# Patient Record
Sex: Female | Born: 1970 | Race: White | Hispanic: No | Smoking: Former smoker
Health system: Southern US, Community
[De-identification: ages and names within clinical notes are randomized; demographics above are authoritative.]

## PROBLEM LIST (undated history)

## (undated) DIAGNOSIS — L98499 Non-pressure chronic ulcer of skin of other sites with unspecified severity: Secondary | ICD-10-CM

## (undated) DIAGNOSIS — G43909 Migraine, unspecified, not intractable, without status migrainosus: Secondary | ICD-10-CM

## (undated) DIAGNOSIS — B009 Herpesviral infection, unspecified: Secondary | ICD-10-CM

## (undated) DIAGNOSIS — F32A Depression, unspecified: Secondary | ICD-10-CM

## (undated) DIAGNOSIS — F329 Major depressive disorder, single episode, unspecified: Secondary | ICD-10-CM

## (undated) DIAGNOSIS — L98439 Non-pressure chronic ulcer of abdomen with unspecified severity: Secondary | ICD-10-CM

## (undated) DIAGNOSIS — K219 Gastro-esophageal reflux disease without esophagitis: Secondary | ICD-10-CM

## (undated) HISTORY — PX: OTHER SURGICAL HISTORY: SHX169

## (undated) HISTORY — PX: CHOLECYSTECTOMY: SHX55

---

## 2004-02-08 HISTORY — PX: GASTRIC BYPASS: SHX52

## 2006-06-17 ENCOUNTER — Other Ambulatory Visit: Payer: Self-pay

## 2006-06-17 ENCOUNTER — Emergency Department: Payer: Self-pay | Admitting: Unknown Physician Specialty

## 2007-08-22 ENCOUNTER — Ambulatory Visit: Payer: Self-pay | Admitting: Gastroenterology

## 2007-10-08 ENCOUNTER — Ambulatory Visit: Payer: Self-pay

## 2008-06-19 ENCOUNTER — Ambulatory Visit: Payer: Self-pay | Admitting: Internal Medicine

## 2009-02-25 ENCOUNTER — Ambulatory Visit: Payer: Self-pay

## 2009-12-27 ENCOUNTER — Emergency Department: Payer: Self-pay | Admitting: Emergency Medicine

## 2010-06-10 ENCOUNTER — Ambulatory Visit: Payer: Self-pay | Admitting: Internal Medicine

## 2010-12-22 ENCOUNTER — Emergency Department: Payer: Self-pay

## 2011-08-17 ENCOUNTER — Ambulatory Visit: Payer: Self-pay | Admitting: Family Medicine

## 2012-07-30 ENCOUNTER — Ambulatory Visit: Payer: Self-pay

## 2012-07-30 ENCOUNTER — Inpatient Hospital Stay: Payer: Self-pay | Admitting: Specialist

## 2012-07-30 LAB — COMPREHENSIVE METABOLIC PANEL
Albumin: 3.4 g/dL (ref 3.4–5.0)
Anion Gap: 6 — ABNORMAL LOW (ref 7–16)
BUN: 13 mg/dL (ref 7–18)
Calcium, Total: 8.2 mg/dL — ABNORMAL LOW (ref 8.5–10.1)
Chloride: 107 mmol/L (ref 98–107)
Co2: 29 mmol/L (ref 21–32)
EGFR (African American): >60
EGFR (Non-African Amer.): >60
Glucose: 89 mg/dL (ref 65–99)
Osmolality: 283 (ref 275–301)
Potassium: 3.5 mmol/L (ref 3.5–5.1)
SGOT(AST): 19 U/L (ref 15–37)
SGPT (ALT): 19 U/L (ref 12–78)
Sodium: 142 mmol/L (ref 136–145)

## 2012-07-30 LAB — CBC
HCT: 33.5 % — ABNORMAL LOW (ref 35.0–47.0)
MCHC: 32.2 g/dL (ref 32.0–36.0)
MCV: 80 fL (ref 80–100)
RDW: 14.3 % (ref 11.5–14.5)
WBC: 6.2 10*3/uL (ref 3.6–11.0)

## 2012-07-30 LAB — URINALYSIS, COMPLETE
Bacteria: NONE SEEN
Blood: NEGATIVE
Nitrite: NEGATIVE
Ph: 5 (ref 4.5–8.0)
RBC,UR: 1 /HPF (ref 0–5)
Squamous Epithelial: 1
WBC UR: 2 /HPF (ref 0–5)

## 2012-07-31 LAB — HEMOGLOBIN
HGB: 10 g/dL — ABNORMAL LOW (ref 12.0–16.0)
HGB: 10.5 g/dL — ABNORMAL LOW (ref 12.0–16.0)

## 2012-07-31 LAB — MAGNESIUM: Magnesium: 1.5 mg/dL — ABNORMAL LOW

## 2012-08-01 LAB — CBC WITH DIFFERENTIAL/PLATELET
Basophil #: 0 10*3/uL (ref 0.0–0.1)
Basophil %: 0.6 %
Eosinophil #: 0.2 10*3/uL (ref 0.0–0.7)
HCT: 30.1 % — ABNORMAL LOW (ref 35.0–47.0)
HGB: 9.8 g/dL — ABNORMAL LOW (ref 12.0–16.0)
MCH: 26.1 pg (ref 26.0–34.0)
MCHC: 32.6 g/dL (ref 32.0–36.0)
MCV: 80 fL (ref 80–100)
Monocyte %: 9.1 %
Neutrophil #: 1.9 10*3/uL (ref 1.4–6.5)
Neutrophil %: 54.5 %
Platelet: 232 10*3/uL (ref 150–440)
RBC: 3.76 10*6/uL — ABNORMAL LOW (ref 3.80–5.20)
RDW: 14.4 % (ref 11.5–14.5)
WBC: 3.4 10*3/uL — ABNORMAL LOW (ref 3.6–11.0)

## 2012-08-01 LAB — MAGNESIUM: Magnesium: 1.7 mg/dL — ABNORMAL LOW

## 2012-08-22 ENCOUNTER — Emergency Department: Payer: Self-pay | Admitting: Emergency Medicine

## 2012-08-22 LAB — URINALYSIS, COMPLETE
Bilirubin,UR: NEGATIVE
Ketone: NEGATIVE
Nitrite: NEGATIVE
Protein: NEGATIVE
Squamous Epithelial: 4
WBC UR: 6 /HPF (ref 0–5)

## 2012-08-22 LAB — COMPREHENSIVE METABOLIC PANEL
Albumin: 3.7 g/dL (ref 3.4–5.0)
Alkaline Phosphatase: 103 U/L (ref 50–136)
Anion Gap: 5 — ABNORMAL LOW (ref 7–16)
BUN: 14 mg/dL (ref 7–18)
Bilirubin,Total: 0.3 mg/dL (ref 0.2–1.0)
Calcium, Total: 8.5 mg/dL (ref 8.5–10.1)
Chloride: 109 mmol/L — ABNORMAL HIGH (ref 98–107)
Co2: 27 mmol/L (ref 21–32)
Creatinine: 0.99 mg/dL (ref 0.60–1.30)
EGFR (African American): >60
EGFR (Non-African Amer.): >60
Glucose: 91 mg/dL (ref 65–99)
Potassium: 3.7 mmol/L (ref 3.5–5.1)
SGOT(AST): 18 U/L (ref 15–37)

## 2012-08-22 LAB — CBC
HCT: 34.5 % — ABNORMAL LOW (ref 35.0–47.0)
HGB: 10.9 g/dL — ABNORMAL LOW (ref 12.0–16.0)
MCH: 25.2 pg — ABNORMAL LOW (ref 26.0–34.0)
MCV: 80 fL (ref 80–100)
RBC: 4.31 10*6/uL (ref 3.80–5.20)
RDW: 15.2 % — ABNORMAL HIGH (ref 11.5–14.5)
WBC: 5.1 10*3/uL (ref 3.6–11.0)

## 2012-08-22 LAB — LIPASE, BLOOD: Lipase: 184 U/L (ref 73–393)

## 2012-08-22 LAB — PREGNANCY, URINE: Pregnancy Test, Urine: NEGATIVE m[IU]/mL

## 2012-08-23 ENCOUNTER — Emergency Department: Payer: Self-pay

## 2012-08-23 LAB — CBC
HCT: 33.7 % — ABNORMAL LOW (ref 35.0–47.0)
HGB: 10.9 g/dL — ABNORMAL LOW (ref 12.0–16.0)
MCH: 25.5 pg — ABNORMAL LOW (ref 26.0–34.0)
MCHC: 32.5 g/dL (ref 32.0–36.0)
MCV: 79 fL — ABNORMAL LOW (ref 80–100)
Platelet: 325 10*3/uL (ref 150–440)
RDW: 15.2 % — ABNORMAL HIGH (ref 11.5–14.5)
WBC: 5 10*3/uL (ref 3.6–11.0)

## 2012-08-23 LAB — URINALYSIS, COMPLETE
Ketone: NEGATIVE
Ph: 5 (ref 4.5–8.0)
Protein: NEGATIVE
Specific Gravity: 1.026 (ref 1.003–1.030)
Squamous Epithelial: 9
WBC UR: 4 /HPF (ref 0–5)

## 2012-08-23 LAB — LIPASE, BLOOD: Lipase: 218 U/L (ref 73–393)

## 2012-08-23 LAB — COMPREHENSIVE METABOLIC PANEL
Alkaline Phosphatase: 105 U/L (ref 50–136)
Calcium, Total: 8.6 mg/dL (ref 8.5–10.1)
Co2: 26 mmol/L (ref 21–32)
Creatinine: 0.77 mg/dL (ref 0.60–1.30)
EGFR (African American): >60
Potassium: 3.6 mmol/L (ref 3.5–5.1)
SGOT(AST): 17 U/L (ref 15–37)
SGPT (ALT): 20 U/L (ref 12–78)

## 2012-10-04 ENCOUNTER — Encounter: Payer: Self-pay | Admitting: *Deleted

## 2012-10-23 ENCOUNTER — Ambulatory Visit: Payer: Self-pay | Admitting: General Surgery

## 2013-01-16 ENCOUNTER — Ambulatory Visit: Payer: Self-pay | Admitting: Internal Medicine

## 2014-05-30 NOTE — H&P (Signed)
PATIENT NAME:  Melanie Olson, Melanie Olson MR#:  [id number] DATE OF BIRTH:  [birth year]  DATE OF ADMISSION:  07/30/2012  PRIMARY CARE PHYSICIAN:  Dr.BURNS REFERRING PHYSICIAN:  Dr. Manson Passey  CHIEF COMPLAINT:  Abdominal pain for 1 week, nausea, vomiting blood today.   HISTORY OF PRESENT ILLNESS:  A 44 year old Caucasian female with a history of anemia, obesity status post gastric bypass who presented to the ED with abdominal pain and vomiting blood today. The patient is alert, awake and oriented in no acute distress. According to the patient, she has had abdominal pain for the past 1 week which is in the epigastric area with cramps, no radiation and relieved by food and exacerbated by movement. She took aspirin for the abdominal pain for the past week. She developed nausea today and then started vomiting a large amount of blood at about 2 to 3 cups. She felt dizzy and weak so she came to the ED for further evaluation.   PAST MEDICAL HISTORY:  Anemia, obesity.   PAST SURGICAL HISTORY:  Gastric bypass, cholecystectomy   FAMILY HISTORY:  Father has hypertension, otherwise negative.   SOCIAL HISTORY:  No smoking or drinking or illicit drugs.   ALLERGIES:  PENICILLIN.   HOME MEDICATIONS:  Acyclovir 200 mg p.o. 4 capsules b.i.d.   REVIEW OF SYSTEMS:  CONSTITUTIONAL: The patient denies any fever or chills. No headache, but has dizziness and weakness. No weight loss.  EYES: No double or blurred vision.  ENT: No postnasal drip, slurred speech or dysphagia. No epistaxis.  CARDIOVASCULAR: No chest pain, palpitation, orthopnea or nocturnal dyspnea. No leg edema.  PULMONARY: No cough, sputum, shortness of breath or hemoptysis.  GASTROINTESTINAL: Positive for abdominal pain, nausea, vomiting and hemoptysis. No black stool or bloody stool, but has constipation.  SKIN: No rash or jaundice.  MUSCULOSKELETAL: No muscle pain. No leg edema.  GENITOURINARY: No dysuria, hematuria or incontinence.  HEMATOLOGY: No easy  bruising or bleeding.  ENDOCRINE: No polyuria, polydipsia, heat or cold intolerance.  NEUROLOGY: No syncope, loss of consciousness or seizure.  PSYCHIATRY: No anxiety or depression, but has had a lot of stress recently.   PHYSICAL EXAMINATION: VITAL SIGNS: Temperature 98.6, blood pressure 112/68, pulse 70, respirations 14, oxygen saturation 100% on room air.  GENERAL: The patient is alert, awake and oriented in no acute distress.  HEENT: Pupils round, equal and reactive to light and accommodation. Moist oral mucosa. Clear oropharynx. Pink conjunctivae.  NECK: Supple. No JVD or carotid bruits. No lymphadenopathy. No thyromegaly.  CARDIOVASCULAR: S1, S2, regular rate, rhythm. No murmurs or gallops.  PULMONARY: Bilateral air entry. No wheezing or rales. No use of accessory muscles to breathe.  ABDOMEN: Soft, obese, tenderness in epigastric area. No rigidity, no rebound, no organomegaly. Bowel sounds present.  EXTREMITIES: No edema, clubbing or cyanosis. No calf tenderness. Strong bilateral pedal pulses.  SKIN: No rash or jaundice. No bruises.  NEUROLOGIC: Alert and oriented x 3. No focal deficit. Power 5 out of 5. Sensation is intact.   DIAGNOSTIC DATA:  Abdominal CT showed no acute abdominopelvic pathology. WBC is 6.2, hemoglobin 10.8, platelets 301. Glucose is 89, BUN 13, creatinine 0.75, electrolytes normal. Lipase is 128. Urinalysis is negative. EKG showed normal sinus rhythm at 72 BPM with incomplete right bundle branch block.   IMPRESSIONS:   1.  Upper gastrointestinal bleeding, need to rule out peptic ulcer disease.  2.  Anemia.  3.  Obesity.   PLAN OF TREATMENT:   1.  The patient will be admitted  to the medical floor. We will keep n.p.o., start Protonix drip, closely monitor hemoglobin q. 6 hours x 3. We will get a GI consult for possible EGD.  2.  We will start on normal saline IV and fluid support.  3.  I discussed the patient's condition and the plan of treatment with the patient  and the patient's mother.  4.  The patient is full code.   TIME SPENT:  About 53 minutes.    ____________________________ Shaune PollackQing Ransome Helwig, MD qc:si D: 07/30/2012 20:54:00 ET T: 07/30/2012 21:50:03 ET JOB#: [id number]  cc: Shaune PollackQing Van Seymore, MD, <Dictator> Shaune PollackQING Joyclyn Plazola MD ELECTRONICALLY SIGNED 07/31/2012 15:21

## 2014-05-30 NOTE — Consult Note (Signed)
VITAL SIGNS/ANCILLARY NOTES: **Vital Signs.:   25-Jun-14 13:28  Vital Signs Type Routine  Temperature Temperature (F) 98.1  Celsius 36.7  Temperature Source oral  Pulse Pulse 61  Respirations Respirations 20  Systolic BP Systolic BP 107  Diastolic BP (mmHg) Diastolic BP (mmHg) 71  Mean BP 83  Pulse Ox % Pulse Ox % 100  Pulse Ox Activity Level  At rest  Oxygen Delivery Room Air/ 21 %   Brief Assessment:  Cardiac Regular   Respiratory clear BS   Gastrointestinal details normal Soft  Nondistended  No masses palpable  Bowel sounds normal  No rebound tenderness  mild epigastric tenderness/pain to palpation   Lab Results: Routine Hem:  25-Jun-14 04:15   WBC (CBC)  3.4  RBC (CBC)  3.76  Hemoglobin (CBC)  9.8  Hematocrit (CBC)  30.1  Platelet Count (CBC) 232  MCV 80  MCH 26.1  MCHC 32.6  RDW 14.4  Neutrophil % 54.5  Lymphocyte % 30.6  Monocyte % 9.1  Eosinophil % 5.2  Basophil % 0.6  Neutrophil # 1.9  Lymphocyte # 1.1  Monocyte # 0.3  Eosinophil # 0.2  Basophil # 0.0 (Result(s) reported on 01 Aug 2012 at 05:11AM.)   Radiology Results: CT:    23-Jun-14 19:37, CT Abdomen and Pelvis With Contrast  CT Abdomen and Pelvis With Contrast   REASON FOR EXAM:    (1) generalized abdominal pain and hematemesis; (2)   generalized abdominal pain a  COMMENTS:       PROCEDURE: CT  - CT ABDOMEN / PELVIS  W  - Jul 30 2012  7:37PM     RESULT: History: Abdominal pain    Comparison:  None    Technique: Multiple axial images of the abdomen and pelvis were performed   from the lung bases to the pubic symphysis, without p.o. contrast and   with 100 ml of Isovue 300 intravenous contrast.    Findings:  The lung bases are clear. There is no pneumothorax. The heart size is   normal.     The liver demonstrates no focal abnormality. There is no intrahepatic or   extrahepatic biliary ductal dilatation. The gallbladder is surgically   absent. The spleen demonstrates no focal  abnormality. The kidneys,   adrenal glands, and pancreas are normal. The bladder is unremarkable.     Evaluation of the bowel is limited secondary to lack of enteric contrast.   There is evidence of prior gastric bypass surgery. There is no bowel   dilatation to suggest obstruction. There is a T type IUD noted. There is   no pneumoperitoneum, pneumatosis, or portal venous gas. There is no   abdominal or pelvic free fluid. There is no lymphadenopathy.     The abdominal aorta is normal in caliber .  The osseous structures areunremarkable.    IMPRESSION:     1. No acute abdominal or pelvic pathology.    Dictation Site: 1        Verified By: Joellyn Haff, M.D., MD   Assessment/Plan:  Assessment/Plan:  Assessment 1)  epigastric pain and hematemesis-stable/no recurrent bleeding, less pain.   2) h/o gastric bypass (roux-en-y)   Plan 1) egd today.  I have discussed the risks benefits and complications of egd to include not limited to bleeding infection perforation and sedation and she wishes to proceed.  Further recs to follow.   Electronic Signatures: Barnetta Chapel (MD)  (Signed 25-Jun-14 13:51)  Authored: VITAL SIGNS/ANCILLARY NOTES, Brief Assessment, Lab Results,  Radiology Results, Assessment/Plan   Last Updated: 25-Jun-14 13:51 by Barnetta ChapelSkulskie, Martin (MD)

## 2014-05-30 NOTE — Consult Note (Signed)
PATIENT NAME:  Melanie Olson, Melanie Olson MR#:  [id number] DATE OF BIRTH:  1970/04/04  DATE OF CONSULTATION:  07/31/2012  REFERRING PHYSICIAN:  Dr. Imogene Burnhen CONSULTING PHYSICIAN:  Melanie Barrehristiane H. Shamieka Gullo, NP  REASON FOR CONSULTATION: GI has been consulted at the request of Dr. Imogene Burnhen to evaluate GI bleeding, anemia.  I appreciate consult for this 44 year old Caucasian woman with history of Roux-en-Y gastric bypass done in 2006, I believe, for evaluation of epigastric pain and hematemesis. The patient reports burning epigastric pain for about a year, has been taking omeprazole 20 mg p.o. daily for about that long, has been using NSAIDs on and off on a fairly consistent basis to help with the abdominal pain and various myalgias.  She stopped using ibuprofen a few months ago when her stomach started burning more, with some improvement.  She had been taking several aspirins over the last week prior to her episode of hematemesis yesterday that necessitated her ED visit and subsequent admission.  She describes her emesis yesterday as coffee-ground with reddish-like swirls. She also reports a history of chronic constipation, last bowel movement was 06/21 described as small and pebbly.  Denies melena, hematochezia, problems swallowing or further GI complaints. She also states that food generally makes her epigastric pain somewhat better and movement makes it worse. Her hemoglobin since admission has run 10.8, 10.0, 10.5. Her platelets have been normal. She had a CT yesterday that did not demonstrate any acute abnormalities.   PAST MEDICAL HISTORY: Anemia, obesity, gastric bypass, cholecystectomy.   FAMILY HISTORY: Significant for hypertension, otherwise negative for colorectal cancer, colon polyps, liver disease, peptic ulcer disease.   SOCIAL HISTORY: Nature conservation officerMarshal arts instructor. No tobacco, EtOH, illicits. Lives at home with her family.   ALLERGIES: PENICILLIN.   HOME MEDICATIONS: Acyclovir 200 mg 4 caps b.i.[name], omeprazole 20 mg  p.o. q. day.   REVIEW OF SYSTEMS: Last menstrual period was 07/19/2012.  Ten systems were reviewed, negative other than what is written above.   LABORATORY DATA: Most recent: Urine pregnancy test negative. Serum glucose 89, BUN 13, creatinine 0.75, sodium 142, potassium 3.5, GFR greater than 60, calcium 8.2, lipase 128, magnesium 1.5, total protein 6.8, albumin 3.4, total bilirubin 0.3, ALP 85, AST 19, ALT 19. WBC 6.2, hemoglobin 10.5, hematocrit 33.5, platelets 301. UA negative.  CT as noted above.   PHYSICAL EXAMINATION: VITAL SIGNS: Most recent: Temperature 98.1, pulse 57, respiratory rate 20, blood pressure 106/69, SaO2 99% on room air.  GENERAL: Well-appearing, pleasant young woman in bed in no acute distress.  HEENT: Normocephalic, atraumatic. Conjunctivae pink. Sclerae are clear. No redness, drainage, or inflammation to the nares and mouth.  NECK: Supple. No JVD, lymphadenopathy, thyromegaly.  CARDIAC: S1, S2, RRR. No MRG. Peripheral pulses 2+. No appreciable edema.  LUNGS: Respirations eupneic. Lungs CTA.  ABDOMEN: Flat, bowel sounds x 4. Mild tenderness in the epigastrium and right upper quadrant. No guarding, rigidity, peritoneal signs, rebound, tenderness or other abnormalities.  EXTREMITIES: No edema, clubbing or cyanosis. Strength 5 out of 5. Sensation intact.  SKIN: Warm, dry, pink. No erythema, lesion or rash.  NEUROLOGIC: Alert, oriented x 3. Cranial nerves II through XII grossly intact. Speech clear. No facial droop.  PSYCHIATRIC: Pleasant, calm, cooperative, logical thinking.   IMPRESSION AND PLAN:  1.  Epigastric pain, hematemesis associated with NSAIDs:   Agree with pantoprazole drip, serial hemoglobins. Would transfuse p.r.n. Do recommend abstaining from NSAIDs.  Can use acetaminophen according to package insert  instead.   2.  I also recommend EGD  for luminal evaluation. The indication, risks and benefits were discussed. She is agreeable. I have discussed this with Dr.  Marva Panda. We will plan this for tomorrow. She may have some clear liquid, nothing red, nothing carbonated tonight.  Further recommendations to follow in this matter.   3.  Chronic constipation:  Once the above is resolved, do recommend daily MiraLax for now.       Thank you very much for this consult. These services were provided by Melanie Pat, MSN, NPC in collaboration with Melanie Deem, MD with whom I have discussed this patient in full.   ____________________________ Melanie Barre, NP chl:cb Olson: 07/31/2012 15:39:58 ET T: 07/31/2012 15:48:56 ET JOB#: [id number]  cc: Melanie Barre, NP, <Dictator> Melanie Maize Brytney Somes FNP ELECTRONICALLY SIGNED 08/01/2012 14:40

## 2014-05-30 NOTE — Consult Note (Signed)
Chief Complaint:  Subjective/Chief Complaint Patient seen and examined, please see full GI consult and brief consult note.  Pait admitted with coffeeground emesis in the setting of h/o roux-en-y gastric bypass and recent asa use.  Concern for PUDz.  Hemodynamically stable, no repeat bleedin since yesterday.  On ppi drip.  continue current.  Will plan egd for tomorrow pm.  I have discussed the risks benefits and complications of egd to include not limited to bleeding infection perforation and sedation and she wishes to proceed.   VITAL SIGNS/ANCILLARY NOTES: **Vital Signs.:   24-Jun-14 13:51  Vital Signs Type Routine  Temperature Temperature (F) 98.1  Celsius 36.7  Temperature Source oral  Pulse Pulse 56  Respirations Respirations 20  Systolic BP Systolic BP 109  Diastolic BP (mmHg) Diastolic BP (mmHg) 69  Mean BP 82  Pulse Ox % Pulse Ox % 98  Pulse Ox Activity Level  At rest  Oxygen Delivery Room Air/ 21 %   Brief Assessment:  Cardiac Regular  no murmur   Respiratory clear BS   Gastrointestinal details normal Soft  Nondistended  Bowel sounds normal  No rebound tenderness  positive epigastric pain to palpation   Lab Results: Routine Hem:  23-Jun-14 18:29   Hemoglobin (CBC)  10.8  24-Jun-14 01:32   Hemoglobin (CBC)  10.0 (Result(s) reported on 31 Jul 2012 at 01:56AM.)    06:30   Hemoglobin (CBC)  10.5 (Result(s) reported on 31 Jul 2012 at 07:04AM.)   Electronic Signatures: Barnetta ChapelSkulskie, Talita Recht (MD)  (Signed 24-Jun-14 19:48)  Authored: Chief Complaint, VITAL SIGNS/ANCILLARY NOTES, Brief Assessment, Lab Results   Last Updated: 24-Jun-14 19:48 by Barnetta ChapelSkulskie, Chukwuemeka Artola (MD)

## 2014-05-30 NOTE — Discharge Summary (Signed)
PATIENT NAME:  Melanie Olson, Melanie Olson MR#:  [id number] DATE OF BIRTH:  1970/12/13  DATE OF ADMISSION:  07/30/2012 DATE OF DISCHARGE:  08/02/2012.   For a detailed note, please take a look at the history and physical on admission by Dr. Imogene Burnhen.   DIAGNOSES AT DISCHARGE: Is as follows, upper gastrointestinal bleed. Coffee ground emesis secondary to the upper the GI bleed. Gastric ulcers. A history of a lap gastric bypass.   DIET:  The patient is being discharged on a clear liquid diet for one more day then full liquid diet for two days and then start a low-residue diet for the next three weeks.   FOLLOW UP:  With Dr. Barnetta ChapelMartin Skulskie next 3 to 4 weeks. Also follow up with Dr. Benjamine SpragueHarriet Byrnes in the next 1 to 2 weeks.   DISCHARGE MEDICATIONS:  Acyclovir 200 mg 4 caps b.i.[name], Protonix 40 mg b.i.[name] and Carafate 10 mL q.i.[name] before meals and bedtime.   CONSULTANTS DURING HOSPITAL COURSE:  Dr. Barnetta ChapelMartin Skulskie from Gastroenterology.  PERTINENT STUDIES DONE DURING THE HOSPITAL COURSE:  Are as follows:  A CT scan of the abdomen and pelvis done with contrast on admission showing no acute intra-abdominal or pelvic pathology. An upper GI endoscopy done on June 25th showing normal esophagus, esophageal motility disorder, gastric bypass with pouch 8 cm in length. Multiple ulcers at the anastomotic site.   HOSPITAL COURSE:  This is a 44 year old female with medical problems as mentioned above, presented to the hospital with abdominal pain, nausea, vomiting and coffee-ground emesis.  1.  Upper GI bleed:  This was likely the cause of the patient's coffee-ground emesis and abdominal pain and nausea and vomiting. The patient was admitted to the hospital, started on a Protonix drip. Her hemoglobin was serially checked but remained stable. She was seen by GI by Dr. Marva PandaSkulskie who ended up performing endoscopy which did show multiple ulcers near her gastric bypass anastomotic site. The patient has had no further evidence of bleeding  since being admitted to the hospital. Post endoscopy, she has been able to tolerate a diet well. She currently is being discharged on a clear liquid diet to be slowly advanced to full liquid over the next few days and eventually to a low-residue diet. She is being discharged on Protonix twice daily along with Carafate. She is to have a repeat endoscopy done in the next 6 to 8 weeks. She will continue followup with Dr. Marva PandaSkulskie as an outpatient.  2.  Hypomagnesemia. This was secondary to the nausea, vomiting and has since then been supplemented and now resolved.   CODE STATUS:  THE PATIENT IS A FULL CODE.   TIME SPENT:  35 minutes.   ____________________________ Rolly PancakeVivek J. Cherlynn KaiserSainani, MD vjs:jm Olson: 08/02/2012 14:30:46 ET T: 08/02/2012 15:40:48 ET JOB#: [id number]  cc: Rolly PancakeVivek J. Cherlynn KaiserSainani, MD, <Dictator> Christena DeemMartin U. Skulskie, MD Hyman HopesHarriett P. Burns, MD Houston SirenVIVEK J Laurieanne Galloway MD ELECTRONICALLY SIGNED 08/10/2012 15:06

## 2014-05-30 NOTE — Consult Note (Signed)
Brief Consult Note: Diagnosis: abdominal pain, hematemesis.   Patient was seen by consultant.   Consult note dictated.   Comments: Appreciate consult for 44 y/o caucasian woman with history of rouen y gastric bypass, for evaluation of epigastric pain and hematemesis. Pt reports burning epigastric pain for about a year. Has been taking Omeprazole 20mg  po daily for about that long. Has been using NSAIDs on and off on a fairly consistent basis to help with the abdominal pain and various myalgias. Stopped using Ibuprofen a few months ago when her stomach started burning more, with some improvement. Had been taking several ASAs over the last week prior to her episode of hematemesis yesterday that necessitated her ED visit and subesquent admission. Describes her emesis that day as coffeeground like with reddish swirls. She is currently on a Protonix gtt and her hemoglobin is stable in the 10s. Reports she is feeling better since admission. Has required pain med and antiemetic once only. She is hemodynamically stable.  Reports also a history of chronic constipation. Last bm 6/21, small and pebbly. Denies melena, hematochezia, problems swallowing, and further GI complaints. Food makes the pain better, movement makes it worse.  Impression and Plan: Epigastric pain. Hematemesis. Associated with NSAIDs. Agree with Pantoprazole gtt, serial hemoglobins. Would transfuse prn. Do recommend abstaining from NSAIDs, can use acetaminophen according to package insert instead. Also recommend EGD for luminal evaluation- indications, risks, and benefits discussed. She is agreeable. Will discuss further with Dr Marva PandaSkulskie and plan to schedule when clinically feasible.                                Chronic constipation- once above resolved, do recommend daily Miralax for now.  Electronic Signatures: Vevelyn PatLondon, Christiane H (NP)  (Signed 24-Jun-14 13:28)  Authored: Brief Consult Note   Last Updated: 24-Jun-14 13:28 by Keturah BarreLondon,  Christiane H (NP)

## 2014-08-21 ENCOUNTER — Other Ambulatory Visit: Payer: Self-pay | Admitting: Internal Medicine

## 2014-08-21 DIAGNOSIS — Z1231 Encounter for screening mammogram for malignant neoplasm of breast: Secondary | ICD-10-CM

## 2014-08-26 ENCOUNTER — Ambulatory Visit: Payer: Self-pay

## 2014-08-29 ENCOUNTER — Ambulatory Visit
Admission: RE | Admit: 2014-08-29 | Discharge: 2014-08-29 | Disposition: A | Discharge status: Home / Self Care | Payer: Self-pay | Source: Ambulatory Visit | Attending: Internal Medicine | Admitting: Internal Medicine

## 2014-08-29 DIAGNOSIS — Z1231 Encounter for screening mammogram for malignant neoplasm of breast: Secondary | ICD-10-CM | POA: Insufficient documentation

## 2014-10-03 ENCOUNTER — Emergency Department
Admission: EM | Admit: 2014-10-03 | Discharge: 2014-10-03 | Disposition: A | Discharge status: Home / Self Care | Payer: Medicaid Other | Attending: Emergency Medicine | Admitting: Emergency Medicine

## 2014-10-03 ENCOUNTER — Encounter: Payer: Self-pay | Admitting: Emergency Medicine

## 2014-10-03 DIAGNOSIS — F329 Major depressive disorder, single episode, unspecified: Secondary | ICD-10-CM | POA: Insufficient documentation

## 2014-10-03 DIAGNOSIS — K2970 Gastritis, unspecified, without bleeding: Secondary | ICD-10-CM | POA: Insufficient documentation

## 2014-10-03 DIAGNOSIS — Z880 Allergy status to penicillin: Secondary | ICD-10-CM | POA: Insufficient documentation

## 2014-10-03 DIAGNOSIS — Z3202 Encounter for pregnancy test, result negative: Secondary | ICD-10-CM | POA: Insufficient documentation

## 2014-10-03 HISTORY — DX: Major depressive disorder, single episode, unspecified: F32.9

## 2014-10-03 HISTORY — DX: Herpesviral infection, unspecified: B00.9

## 2014-10-03 HISTORY — DX: Non-pressure chronic ulcer of abdomen with unspecified severity: L98.439

## 2014-10-03 HISTORY — DX: Non-pressure chronic ulcer of skin of other sites with unspecified severity: L98.499

## 2014-10-03 HISTORY — DX: Migraine, unspecified, not intractable, without status migrainosus: G43.909

## 2014-10-03 HISTORY — DX: Depression, unspecified: F32.A

## 2014-10-03 LAB — CBC
HCT: 41.9 % (ref 35.0–47.0)
HEMOGLOBIN: 13.8 g/dL (ref 12.0–16.0)
MCH: 29 pg (ref 26.0–34.0)
MCHC: 32.9 g/dL (ref 32.0–36.0)
MCV: 88.1 fL (ref 80.0–100.0)
Platelets: 294 10*3/uL (ref 150–440)
RBC: 4.76 MIL/uL (ref 3.80–5.20)
RDW: 12.6 % (ref 11.5–14.5)
WBC: 5.5 10*3/uL (ref 3.6–11.0)

## 2014-10-03 LAB — URINALYSIS COMPLETE WITH MICROSCOPIC (ARMC ONLY)
BILIRUBIN URINE: NEGATIVE
GLUCOSE, UA: NEGATIVE mg/dL
Hgb urine dipstick: NEGATIVE
NITRITE: NEGATIVE
Protein, ur: 30 mg/dL — AB
Specific Gravity, Urine: 1.025 (ref 1.005–1.030)
pH: 5 (ref 5.0–8.0)

## 2014-10-03 LAB — LIPASE, BLOOD: Lipase: 22 U/L (ref 22–51)

## 2014-10-03 LAB — COMPREHENSIVE METABOLIC PANEL
ALK PHOS: 86 U/L (ref 38–126)
ALT: 16 U/L (ref 14–54)
ANION GAP: 10 (ref 5–15)
AST: 21 U/L (ref 15–41)
Albumin: 4.2 g/dL (ref 3.5–5.0)
BILIRUBIN TOTAL: 1.1 mg/dL (ref 0.3–1.2)
BUN: 11 mg/dL (ref 6–20)
CALCIUM: 9.1 mg/dL (ref 8.9–10.3)
CO2: 23 mmol/L (ref 22–32)
Chloride: 105 mmol/L (ref 101–111)
Creatinine, Ser: 0.68 mg/dL (ref 0.44–1.00)
GFR calc Af Amer: >60 mL/min (ref >60)
GFR calc non Af Amer: >60 mL/min (ref >60)
Glucose, Bld: 100 mg/dL — ABNORMAL HIGH (ref 65–99)
Potassium: 3.5 mmol/L (ref 3.5–5.1)
Sodium: 138 mmol/L (ref 135–145)
TOTAL PROTEIN: 7.5 g/dL (ref 6.5–8.1)

## 2014-10-03 LAB — POCT PREGNANCY, URINE: Preg Test, Ur: NEGATIVE

## 2014-10-03 MED ORDER — HYDROCODONE-ACETAMINOPHEN 5-325 MG PO TABS: 1.0000 | ORAL_TABLET | ORAL | Status: DC | PRN

## 2014-10-03 MED ORDER — SODIUM CHLORIDE 0.9 % IV BOLUS (SEPSIS)
1000.0000 mL | Freq: Once | INTRAVENOUS | Status: AC
Start: 2014-10-03 — End: 2014-10-03
  Administered 2014-10-03: 1000 mL via INTRAVENOUS

## 2014-10-03 MED ORDER — ONDANSETRON 4 MG PO TBDP: 4.0000 mg | ORAL_TABLET | Freq: Three times a day (TID) | ORAL | Status: DC | PRN

## 2014-10-03 MED ORDER — MORPHINE SULFATE (PF) 4 MG/ML IV SOLN
4.0000 mg | Freq: Once | INTRAVENOUS | Status: AC
  Administered 2014-10-03: 4 mg via INTRAVENOUS
  Filled 2014-10-03: qty 1

## 2014-10-03 MED ORDER — GI COCKTAIL ~~LOC~~
30.0000 mL | Freq: Once | ORAL | Status: AC
  Administered 2014-10-03: 30 mL via ORAL
  Filled 2014-10-03: qty 30

## 2014-10-03 MED ORDER — PANTOPRAZOLE SODIUM 40 MG PO TBEC: 40.0000 mg | DELAYED_RELEASE_TABLET | Freq: Every day | ORAL | Status: DC

## 2014-10-03 MED ORDER — ONDANSETRON HCL 4 MG/2ML IJ SOLN
4.0000 mg | Freq: Once | INTRAMUSCULAR | Status: AC
  Administered 2014-10-03: 4 mg via INTRAVENOUS
  Filled 2014-10-03: qty 2

## 2014-10-03 MED ORDER — FOSFOMYCIN TROMETHAMINE 3 G PO PACK
3.0000 g | PACK | Freq: Once | ORAL | Status: AC
  Administered 2014-10-03: 3 g via ORAL
  Filled 2014-10-03: qty 3

## 2014-10-03 MED ORDER — HYDROMORPHONE HCL 1 MG/ML IJ SOLN
1.0000 mg | Freq: Once | INTRAMUSCULAR | Status: AC
  Administered 2014-10-03: 1 mg via INTRAVENOUS
  Filled 2014-10-03: qty 1

## 2014-10-03 MED ORDER — SODIUM CHLORIDE 0.9 % IV BOLUS (SEPSIS)
1000.0000 mL | Freq: Once | INTRAVENOUS | Status: AC
  Administered 2014-10-03: 1000 mL via INTRAVENOUS

## 2014-10-03 NOTE — ED Notes (Signed)
Pt to ed with c/o abd pain and vomiting x 3 days.  Pt reports bm today WNL.  Pt states hx of ulcers in abd.

## 2014-10-03 NOTE — ED Provider Notes (Signed)
[facility] Emergency Department Provider Note  Time seen: 6:25 PM  I have reviewed the triage vital signs and the nursing notes.   HISTORY  Chief Complaint Abdominal Pain and Emesis    HPI Melanie Olson is a 44 y.o. female with a history of gastric ulcers, depression, presents the emergency department with 1 week of epigastric abdominal pain, and 3 days of nausea and vomiting. According to the patient she has not been able to keep down anything orally for the past 2 days. States her abdominal pain is increased, and worsens when she vomits. Describes as moderate dull/burning pain in the epigastrium. She states she was diagnosed with gastric ulcers by an endoscopy in 2014, and states this feels very similar to that. States her gallbladder was removed years ago. Does not drink alcohol. Denies any NSAID use. Denies fever, diarrhea, dysuria.    Past Medical History  Diagnosis Date  . Depression   . HSV (herpes simplex virus) infection   . Abdominal wall ulcer   . Migraine     There are no active problems to display for this patient.   History reviewed. No pertinent past surgical history.  No current outpatient prescriptions on file.  Allergies Penicillins  History reviewed. No pertinent family history.  Social History Social History  Substance Use Topics  . Smoking status: Never Smoker   . Smokeless tobacco: None  . Alcohol Use: No    Review of Systems Constitutional: Negative for fever. Cardiovascular: Negative for chest pain. Respiratory: Negative for shortness of breath. Gastrointestinal: Positive for epigastric pain, nausea and vomiting. Negative for diarrhea or constipation. Genitourinary: Negative for dysuria. Musculoskeletal: Negative for back pain. Neurological: Negative for headache 10-point ROS otherwise negative.  ____________________________________________   PHYSICAL EXAM:  VITAL SIGNS: ED Triage Vitals  Enc Vitals  Group     BP 10/03/14 1434 124/76 mmHg     Pulse Rate 10/03/14 1434 71     Resp 10/03/14 1434 20     Temp 10/03/14 1434 97.6 F (36.4 C)     Temp Source 10/03/14 1434 Oral     SpO2 10/03/14 1434 100 %     Weight 10/03/14 1434 180 lb (81.647 kg)     Height 10/03/14 1434 5\' 2"  (1.575 m)     Head Cir --      Peak Flow --      Pain Score 10/03/14 1435 7     Pain Loc --      Pain Edu? --      Excl. in GC? --     Constitutional: Alert and oriented. Well appearing and in no distress. Eyes: Normal exam ENT   Head: Normocephalic and atraumatic.   Mouth/Throat: Dry mucous membranes Cardiovascular: Normal rate, regular rhythm.  Respiratory: Normal respiratory effort without tachypnea nor retractions. Breath sounds are clear and equal bilaterally. No wheezes/rales/rhonchi. Gastrointestinal: Soft, moderate epigastric tenderness palpation. No rebound or guarding. No distention. Musculoskeletal: Nontender with normal range of motion in all extremities. No lower extremity tenderness or edema. Neurologic:  Normal speech and language. No gross focal neurologic deficits  Skin:  Skin is warm, dry and intact.  Psychiatric: Mood and affect are normal. Speech and behavior are normal. Patient exhibits appropriate insight and judgment.  ____________________________________________    INITIAL IMPRESSION / ASSESSMENT AND PLAN / ED COURSE  Pertinent labs & imaging results that were available during my care of the patient were reviewed by me and considered in my medical decision making (see  chart for details).  Patient with epigastric abdominal pain times one week, nausea and vomiting 3 days. Patient's labs are consistent with dehydration given 3+ ketones and a urinalysis. Patient also has many bacteria and a urinalysis, we will treat with fosfomycin. Patient's labs otherwise are largely within normal limits. I discussed with the patient given a history of gastric ulcers, similar presentation, and  location of discomfort the likelihood of this being gastritis first gastric ulcers is extremely high. Patient's lipase within normal limits. We will attempt pain and nausea control, IV hydration in the emergency department, and a GI cocktail as well. The patient improves we will likely discharge home with GI follow-up, if the patient fails to improve we will revisit abdominal imaging. Pt care signed out to Dr. York Cerise.  ____________________________________________   FINAL CLINICAL IMPRESSION(S) / ED DIAGNOSES  Epigastric abdominal pain Gastritis   Minna Antis, MD 10/04/14 [id number]

## 2014-10-03 NOTE — ED Provider Notes (Signed)
-----------------------------------------   9:47 PM on 10/03/2014 -----------------------------------------   Blood pressure 137/84, pulse 68, temperature 98.8 F (37.1 C), temperature source Oral, resp. rate 18, height  (1.575 m), weight 180 lb (81.647 kg), last menstrual period 09/26/2014, SpO2 100 %.  Assuming care from Dr. Lenard Lance.  In short, Melanie Olson is a 44 y.o. female with a chief complaint of Abdominal Pain and Emesis .  Refer to the original H&P for additional details.  The current plan of care is to reassess after IV fluids.  Currently getting Protonix, Norco.   ----------------------------------------- 10:43 PM on 10/03/2014 -----------------------------------------  The patient states she feels much better now.  She is finishing up her 2 L of fluid.  She still wants to go home.  She has no significant tenderness to her abdomen.  I will discharge her per Dr. Awanda Mink plan.  We discussed return precautions.  Loleta Rose, MD 10/03/14 [phone]

## 2014-10-03 NOTE — Discharge Instructions (Signed)
You have been seen in the emergency department for upper abdominal pain most consistent with gastritis or gastric ulcers. Please take your prescribed Protonix every day as written, you may also take over-the-counter Maalox as needed for symptoms. Please call the number provided for GI medicine as it is possible to arrange a follow-up appointment for further evaluation. You may also take her prescribed pain medication as needed for severe discomfort. You may also take Zofran as needed for nausea, as written. If symptoms worsen, abdominal pain worsens, or your unable to tolerate fluids or medications due to vomiting please return to the emergency department for further evaluation.    Gastritis, Adult Gastritis is soreness and puffiness (inflammation) of the lining of the stomach. If you do not get help, gastritis can cause bleeding and sores (ulcers) in the stomach. HOME CARE   Only take medicine as told by your doctor.  If you were given antibiotic medicines, take them as told. Finish the medicines even if you start to feel better.  Drink enough fluids to keep your pee (urine) clear or pale yellow.  Avoid foods and drinks that make your problems worse. Foods you may want to avoid include:  Caffeine or alcohol.  Chocolate.  Mint.  Garlic and onions.  Spicy foods.  Citrus fruits, including oranges, lemons, or limes.  Food containing tomatoes, including sauce, chili, salsa, and pizza.  Fried and fatty foods.  Eat small meals throughout the day instead of large meals. GET HELP RIGHT AWAY IF:   You have black or dark red poop (stools).  You throw up (vomit) blood. It may look like coffee grounds.  You cannot keep fluids down.  Your belly (abdominal) pain gets worse.  You have a fever.  You do not feel better after 1 week.  You have any other questions or concerns. MAKE SURE YOU:   Understand these instructions.  Will watch your condition.  Will get help right away if  you are not doing well or get worse. Document Released: 07/13/2007 Document Revised: 04/18/2011 Document Reviewed: 03/09/2011 [facility] Patient Information 2015 [place], [place]. This information is not intended to replace advice given to you by your health care provider. Make sure you discuss any questions you have with your health care provider.

## 2015-12-16 ENCOUNTER — Other Ambulatory Visit: Payer: Self-pay | Admitting: Internal Medicine

## 2015-12-16 DIAGNOSIS — N83291 Other ovarian cyst, right side: Secondary | ICD-10-CM

## 2015-12-16 DIAGNOSIS — R1032 Left lower quadrant pain: Secondary | ICD-10-CM

## 2015-12-23 ENCOUNTER — Ambulatory Visit
Admission: RE | Admit: 2015-12-23 | Discharge: 2015-12-23 | Disposition: A | Discharge status: Home / Self Care | Payer: Self-pay | Source: Ambulatory Visit | Attending: Internal Medicine | Admitting: Internal Medicine

## 2015-12-23 DIAGNOSIS — N83291 Other ovarian cyst, right side: Secondary | ICD-10-CM

## 2015-12-23 DIAGNOSIS — Z975 Presence of (intrauterine) contraceptive device: Secondary | ICD-10-CM | POA: Insufficient documentation

## 2015-12-23 DIAGNOSIS — R1032 Left lower quadrant pain: Secondary | ICD-10-CM | POA: Insufficient documentation

## 2017-07-21 ENCOUNTER — Ambulatory Visit
Admission: RE | Admit: 2017-07-21 | Discharge: 2017-07-21 | Disposition: A | Discharge status: Home / Self Care | Payer: Self-pay | Source: Ambulatory Visit | Attending: Family Medicine | Admitting: Family Medicine

## 2017-07-21 ENCOUNTER — Other Ambulatory Visit: Payer: Self-pay | Admitting: Family Medicine

## 2017-07-21 DIAGNOSIS — R52 Pain, unspecified: Secondary | ICD-10-CM

## 2017-07-21 DIAGNOSIS — M25551 Pain in right hip: Secondary | ICD-10-CM | POA: Insufficient documentation

## 2017-11-17 ENCOUNTER — Other Ambulatory Visit: Payer: Self-pay | Admitting: Family Medicine

## 2017-11-17 DIAGNOSIS — Z1231 Encounter for screening mammogram for malignant neoplasm of breast: Secondary | ICD-10-CM

## 2019-05-30 ENCOUNTER — Other Ambulatory Visit: Payer: Self-pay

## 2019-05-30 ENCOUNTER — Emergency Department
Admission: EM | Admit: 2019-05-30 | Discharge: 2019-05-30 | Disposition: A | Discharge status: Home / Self Care | Payer: Self-pay | Attending: Student in an Organized Health Care Education/Training Program | Admitting: Student in an Organized Health Care Education/Training Program

## 2019-05-30 ENCOUNTER — Emergency Department: Payer: Self-pay

## 2019-05-30 DIAGNOSIS — R110 Nausea: Secondary | ICD-10-CM | POA: Insufficient documentation

## 2019-05-30 DIAGNOSIS — N939 Abnormal uterine and vaginal bleeding, unspecified: Secondary | ICD-10-CM | POA: Insufficient documentation

## 2019-05-30 DIAGNOSIS — R109 Unspecified abdominal pain: Secondary | ICD-10-CM | POA: Insufficient documentation

## 2019-05-30 DIAGNOSIS — Z79899 Other long term (current) drug therapy: Secondary | ICD-10-CM | POA: Insufficient documentation

## 2019-05-30 LAB — CBC
HCT: 30 % — ABNORMAL LOW (ref 36.0–46.0)
Hemoglobin: 9 g/dL — ABNORMAL LOW (ref 12.0–15.0)
MCH: 21 pg — ABNORMAL LOW (ref 26.0–34.0)
MCHC: 30 g/dL (ref 30.0–36.0)
MCV: 69.9 fL — ABNORMAL LOW (ref 80.0–100.0)
Platelets: 435 10*3/uL — ABNORMAL HIGH (ref 150–400)
RBC: 4.29 MIL/uL (ref 3.87–5.11)
RDW: 16.9 % — ABNORMAL HIGH (ref 11.5–15.5)
WBC: 5.2 10*3/uL (ref 4.0–10.5)
nRBC: 0 % (ref 0.0–0.2)

## 2019-05-30 LAB — URINALYSIS, COMPLETE (UACMP) WITH MICROSCOPIC
Bilirubin Urine: NEGATIVE
Glucose, UA: NEGATIVE mg/dL
Ketones, ur: 5 mg/dL — AB
Leukocytes,Ua: NEGATIVE
Nitrite: NEGATIVE
Protein, ur: NEGATIVE mg/dL
Specific Gravity, Urine: 1.017 (ref 1.005–1.030)
pH: 6 (ref 5.0–8.0)

## 2019-05-30 LAB — COMPREHENSIVE METABOLIC PANEL
ALT: 28 U/L (ref 0–44)
AST: 22 U/L (ref 15–41)
Albumin: 3.8 g/dL (ref 3.5–5.0)
Alkaline Phosphatase: 96 U/L (ref 38–126)
Anion gap: 5 (ref 5–15)
BUN: 10 mg/dL (ref 6–20)
CO2: 28 mmol/L (ref 22–32)
Calcium: 8.9 mg/dL (ref 8.9–10.3)
Chloride: 104 mmol/L (ref 98–111)
Creatinine, Ser: 0.45 mg/dL (ref 0.44–1.00)
GFR calc Af Amer: >60 mL/min (ref >60)
GFR calc non Af Amer: >60 mL/min (ref >60)
Glucose, Bld: 105 mg/dL — ABNORMAL HIGH (ref 70–99)
Potassium: 3.8 mmol/L (ref 3.5–5.1)
Sodium: 137 mmol/L (ref 135–145)
Total Bilirubin: 0.4 mg/dL (ref 0.3–1.2)
Total Protein: 7.1 g/dL (ref 6.5–8.1)

## 2019-05-30 LAB — POCT PREGNANCY, URINE: Preg Test, Ur: NEGATIVE

## 2019-05-30 LAB — LIPASE, BLOOD: Lipase: 34 U/L (ref 11–51)

## 2019-05-30 MED ORDER — ONDANSETRON HCL 4 MG PO TABS: 4.0000 mg | ORAL_TABLET | Freq: Every day | ORAL | 0 refills | Status: DC | PRN

## 2019-05-30 MED ORDER — OXYCODONE HCL 5 MG PO TABS: 5.0000 mg | ORAL_TABLET | Freq: Three times a day (TID) | ORAL | 0 refills | Status: DC | PRN

## 2019-05-30 MED ORDER — SUCRALFATE 1 G PO TABS
1.0000 g | ORAL_TABLET | Freq: Once | ORAL | Status: AC
  Administered 2019-05-30: 1 g via ORAL
  Filled 2019-05-30: qty 1

## 2019-05-30 MED ORDER — IRON 90 (18 FE) MG PO TABS: 1.0000 | ORAL_TABLET | Freq: Every day | ORAL | 0 refills | Status: DC

## 2019-05-30 MED ORDER — ONDANSETRON 4 MG PO TBDP
4.0000 mg | ORAL_TABLET | Freq: Once | ORAL | Status: AC
  Administered 2019-05-30: 18:00:00 4 mg via ORAL
  Filled 2019-05-30: qty 1

## 2019-05-30 MED ORDER — SODIUM CHLORIDE 0.9% FLUSH: 3.0000 mL | Freq: Once | INTRAVENOUS | Status: DC

## 2019-05-30 MED ORDER — OXYCODONE HCL 5 MG PO TABS
10.0000 mg | ORAL_TABLET | ORAL | Status: DC | PRN
  Administered 2019-05-30: 18:00:00 10 mg via ORAL
  Filled 2019-05-30: qty 2

## 2019-05-30 NOTE — ED Triage Notes (Addendum)
Pt comes via POV from home with c/o abdominal pain and bleeding since March 15th. Pt states her PCP placed her on birth control and that has not helped.  Pt states pain with no relief and heavy bleeding.  Pt states she was just started on the [facility] yesterday. Pt states some chills and nausea.

## 2019-05-30 NOTE — ED Provider Notes (Addendum)
[facility] Emergency Department Provider Note    First MD Initiated Contact with Patient 05/30/19 1645     (approximate)  I have reviewed the triage vital signs and the nursing notes.   HISTORY  Chief Complaint Vaginal Bleeding    HPI Melanie Olson is a 49 y.o. female bolus past medical history presents to the ER for chief complaint of 6 weeks of abdominal cramping and heavy vaginal bleeding.  States that she was soaking through a box of tampons per day.  She did finally call her PCP and was placed on OCP yesterday and states that since starting that medication her bleeding has stopped but she has having persistent nausea and crampy abdominal pain.  She does wear IUD.  Denies any chance of being pregnant.  She has been taking Tylenol for the pain but not getting any relief from this.  She denies any melena or hematochezia.  Past Medical History:  Diagnosis Date  . Abdominal wall ulcer ([place])   . Depression   . HSV (herpes simplex virus) infection   . Migraine    No family history on file. History reviewed. No pertinent surgical history. There are no problems to display for this patient.     Prior to Admission medications   Medication Sig Start Date End Date Taking? Authorizing Provider  Ferrous Sulfate (IRON) 90 (18 Fe) MG TABS Take 1 tablet by mouth daily. 05/30/19   Merlyn Lot, MD  HYDROcodone-acetaminophen (NORCO/VICODIN) 5-325 MG per tablet Take 1 tablet by mouth every 4 (four) hours as needed for moderate pain. 10/03/14   Harvest Dark, MD  ondansetron (ZOFRAN ODT) 4 MG disintegrating tablet Take 1 tablet (4 mg total) by mouth every 8 (eight) hours as needed for nausea or vomiting. 10/03/14   Harvest Dark, MD  ondansetron (ZOFRAN) 4 MG tablet Take 1 tablet (4 mg total) by mouth daily as needed. 05/30/19 05/29/20  Merlyn Lot, MD  oxyCODONE (ROXICODONE) 5 MG immediate release tablet Take 1 tablet (5 mg total) by mouth every 8  (eight) hours as needed. 05/30/19 05/29/20  Merlyn Lot, MD  pantoprazole (PROTONIX) 40 MG tablet Take 1 tablet (40 mg total) by mouth daily. 10/03/14   Harvest Dark, MD    Allergies Penicillins    Social History Social History   Tobacco Use  . Smoking status: Never Smoker  Substance Use Topics  . Alcohol use: No  . Drug use: No    Review of Systems Patient denies headaches, rhinorrhea, blurry vision, numbness, shortness of breath, chest pain, edema, cough, abdominal pain, nausea, vomiting, diarrhea, dysuria, fevers, rashes or hallucinations unless otherwise stated above in HPI. ____________________________________________   PHYSICAL EXAM:  VITAL SIGNS: Vitals:   05/30/19 1930 05/30/19 2137  BP: 110/63 122/72  Pulse: 78 79  Resp:  16  Temp:    SpO2: 99% 100%    Constitutional: Alert and oriented.  Eyes: Conjunctivae are normal.  Head: Atraumatic. Nose: No congestion/rhinnorhea. Mouth/Throat: Mucous membranes are moist.   Neck: No stridor. Painless ROM.  Cardiovascular: Normal rate, regular rhythm. Grossly normal heart sounds.  Good peripheral circulation. Respiratory: Normal respiratory effort.  No retractions. Lungs CTAB. Gastrointestinal: Soft and nontender. No distention. No abdominal bruits. No CVA tenderness. Genitourinary: deferred Musculoskeletal: No lower extremity tenderness nor edema.  No joint effusions. Neurologic:  Normal speech and language. No gross focal neurologic deficits are appreciated. No facial droop Skin:  Skin is warm, dry and intact. No rash noted. Psychiatric: Mood and affect  are normal. Speech and behavior are normal.  ____________________________________________   LABS (all labs ordered are listed, but only abnormal results are displayed)  Results for orders placed or performed during the hospital encounter of 05/30/19 (from the past 24 hour(s))  Lipase, blood     Status: None   Collection Time: 05/30/19  2:24 PM  Result  Value Ref Range   Lipase 34 11 - 51 U/L  Comprehensive metabolic panel     Status: Abnormal   Collection Time: 05/30/19  2:24 PM  Result Value Ref Range   Sodium 137 135 - 145 mmol/L   Potassium 3.8 3.5 - 5.1 mmol/L   Chloride 104 98 - 111 mmol/L   CO2 28 22 - 32 mmol/L   Glucose, Bld 105 (H) 70 - 99 mg/dL   BUN 10 6 - 20 mg/dL   Creatinine, Ser 1.61 0.44 - 1.00 mg/dL   Calcium 8.9 8.9 - 09.6 mg/dL   Total Protein 7.1 6.5 - 8.1 g/dL   Albumin 3.8 3.5 - 5.0 g/dL   AST 22 15 - 41 U/L   ALT 28 0 - 44 U/L   Alkaline Phosphatase 96 38 - 126 U/L   Total Bilirubin 0.4 0.3 - 1.2 mg/dL   GFR calc non Af Amer >60 >60 mL/min   GFR calc Af Amer >60 >60 mL/min   Anion gap 5 5 - 15  CBC     Status: Abnormal   Collection Time: 05/30/19  2:24 PM  Result Value Ref Range   WBC 5.2 4.0 - 10.5 K/uL   RBC 4.29 3.87 - 5.11 MIL/uL   Hemoglobin 9.0 (L) 12.0 - 15.0 g/dL   HCT 04.5 (L) 40.9 - 81.1 %   MCV 69.9 (L) 80.0 - 100.0 fL   MCH 21.0 (L) 26.0 - 34.0 pg   MCHC 30.0 30.0 - 36.0 g/dL   RDW 91.4 (H) 78.2 - 95.6 %   Platelets 435 (H) 150 - 400 K/uL   nRBC 0.0 0.0 - 0.2 %  Urinalysis, Complete w Microscopic     Status: Abnormal   Collection Time: 05/30/19  7:06 PM  Result Value Ref Range   Color, Urine YELLOW (A) YELLOW   APPearance HAZY (A) CLEAR   Specific Gravity, Urine 1.017 1.005 - 1.030   pH 6.0 5.0 - 8.0   Glucose, UA NEGATIVE NEGATIVE mg/dL   Hgb urine dipstick SMALL (A) NEGATIVE   Bilirubin Urine NEGATIVE NEGATIVE   Ketones, ur 5 (A) NEGATIVE mg/dL   Protein, ur NEGATIVE NEGATIVE mg/dL   Nitrite NEGATIVE NEGATIVE   Leukocytes,Ua NEGATIVE NEGATIVE   RBC / HPF 0-5 0 - 5 RBC/hpf   WBC, UA 0-5 0 - 5 WBC/hpf   Bacteria, UA RARE (A) NONE SEEN   Squamous Epithelial / LPF 0-5 0 - 5   Mucus PRESENT   Pregnancy, urine POC     Status: None   Collection Time: 05/30/19  7:12 PM  Result Value Ref Range   Preg Test, Ur NEGATIVE NEGATIVE    ____________________________________________  EKG_______________________________  RADIOLOGY  I personally reviewed all radiographic images ordered to evaluate for the above acute complaints and reviewed radiology reports and findings.  These findings were personally discussed with the patient.  Please see medical record for radiology report.  ____________________________________________   PROCEDURES  Procedure(s) performed:  Procedures    Critical Care performed: no ____________________________________________   INITIAL IMPRESSION / ASSESSMENT AND PLAN / ED COURSE  Pertinent labs & imaging results that  were available during my care of the patient were reviewed by me and considered in my medical decision making (see chart for details).   DDX: Acute blood loss anemia, thyroid, ectopic,  DAYTON SHERR is a 49 y.o. who presents to the ED with symptoms as described above.  Patient is pale but nontoxic-appearing.  Since like she is been having bleeding for several weeks.  She not having signs of melena or hematochezia to suggest GI bleed.  Her abdominal exam is soft and benign but given her presentation will order ultrasound to further evaluate.  She reports that she is not having any bleeding since starting the birth control.  Clinical Course as of May 30 2154  Thu May 30, 2019  2114 Patient's bleeding has stopped secondary to birth control started yesterday therefore I do believe she is stable.  Her ultrasound does show fibroids as well as possibly a malpositioned IUD.  I discussed the results with Dr.Beasley who will help arrange close outpatient follow-up.  I think a lot of her pain is probably secondary to some nausea and discomfort with starting high-dose OCP.  She not had any signs and symptoms or GI bleed.  At this point I will order some analgesia as well as antiemetic and supplemental iron.   [PR]    Clinical Course User Index [PR] Willy Eddy, MD    The patient  was evaluated in Emergency Department today for the symptoms described in the history of present illness. He/she was evaluated in the context of the global COVID-19 pandemic, which necessitated consideration that the patient might be at risk for infection with the SARS-CoV-2 virus that causes COVID-19. Institutional protocols and algorithms that pertain to the evaluation of patients at risk for COVID-19 are in a state of rapid change based on information released by regulatory bodies including the CDC and federal and state organizations. These policies and algorithms were followed during the patient's care in the ED.  As part of my medical decision making, I reviewed the following data within the electronic MEDICAL RECORD NUMBER Nursing notes reviewed and incorporated, Labs reviewed, notes from prior ED visits and [place] Controlled Substance Database   ____________________________________________   FINAL CLINICAL IMPRESSION(S) / ED DIAGNOSES  Final diagnoses:  Vaginal bleeding      NEW MEDICATIONS STARTED DURING THIS VISIT:  Discharge Medication List as of 05/30/2019  9:21 PM    START taking these medications   Details  Ferrous Sulfate (IRON) 90 (18 Fe) MG TABS Take 1 tablet by mouth daily., Starting Thu 05/30/2019, Normal    ondansetron (ZOFRAN) 4 MG tablet Take 1 tablet (4 mg total) by mouth daily as needed., Starting Thu 05/30/2019, Until Fri 05/29/2020, Normal    oxyCODONE (ROXICODONE) 5 MG immediate release tablet Take 1 tablet (5 mg total) by mouth every 8 (eight) hours as needed., Starting Thu 05/30/2019, Until Fri 05/29/2020, Normal         Note:  This document was prepared using Dragon voice recognition software and may include unintentional dictation errors.    Willy Eddy, MD 05/30/19 2118    Willy Eddy, MD 05/30/19 2156

## 2019-05-30 NOTE — Discharge Instructions (Addendum)
I have ordered some for nausea, pain as well as supplemental iron for you to take.  Please continue to take the birth control as was prescribed as this is helping prevent additional bleeding.  Please follow-up with OB/GYN.  Return to the ER if you develop any worsening weakness shortness of breath or the bleeding returns.

## 2019-05-30 NOTE — Consult Note (Signed)
Consult History and Physical   SERVICE: Gynecology   Patient Name: Melanie Olson Patient MRN:   [id number]  CC: Heavy bleeding with Paragard IUD and abdominal pain  HPI: Melanie Olson is a 49 y.o. G2P0 with a recent history of heavy bleeding.  PCP gave her medication which has stopped the bleeding. She has also had upper abdominal pain for a while, reports it is not cramps but higher and worse at night or when she was passing large clots. At home she was medicating with Tylenol with little relief. She is unable to take NSAIDs d/t ulcer.   Review of Systems: A full review of systems was performed and negative except as noted in the HPI.   Past Obstetrical History: OB History    Gravida  2   Para      Term      Preterm      AB      Living        SAB      TAB      Ectopic      Multiple      Live Births              Past Gynecologic History: No LMP recorded. Menstrual bleeding has been heavy, reports changing "super" tampons every couple of hours  Past Medical History: Past Medical History:  Diagnosis Date  . Abdominal wall ulcer ([facility])   . Depression   . HSV (herpes simplex virus) infection   . Migraine     Past Surgical History:  History reviewed. No pertinent surgical history.  Family History:  family history is not on file.  Social History:  Social History   Socioeconomic History  . Marital status: Unknown    Spouse name: Not on file  . Number of children: Not on file  . Years of education: Not on file  . Highest education level: Not on file  Occupational History  . Not on file  Tobacco Use  . Smoking status: Never Smoker  Substance and Sexual Activity  . Alcohol use: No  . Drug use: No  . Sexual activity: Not on file  Other Topics Concern  . Not on file  Social History Narrative  . Not on file   Social Determinants of Health   Financial Resource Strain:   . Difficulty of Paying Living Expenses:   Food Insecurity:   .  Worried About Programme researcher, broadcasting/film/video in the Last Year:   . Barista in the Last Year:   Transportation Needs:   . Freight forwarder (Medical):   Marland Kitchen Lack of Transportation (Non-Medical):   Physical Activity:   . Days of Exercise per Week:   . Minutes of Exercise per Session:   Stress:   . Feeling of Stress :   Social Connections:   . Frequency of Communication with Friends and Family:   . Frequency of Social Gatherings with Friends and Family:   . Attends Religious Services:   . Active Member of Clubs or Organizations:   . Attends Banker Meetings:   Marland Kitchen Marital Status:   Intimate Partner Violence:   . Fear of Current or Ex-Partner:   . Emotionally Abused:   Marland Kitchen Physically Abused:   . Sexually Abused:     Home Medications:  Medications reconciled in EPIC  No current facility-administered medications on file prior to encounter.   Current Outpatient Medications on File Prior to Encounter  Medication Sig Dispense  Refill  . HYDROcodone-acetaminophen (NORCO/VICODIN) 5-325 MG per tablet Take 1 tablet by mouth every 4 (four) hours as needed for moderate pain. 20 tablet 0  . ondansetron (ZOFRAN ODT) 4 MG disintegrating tablet Take 1 tablet (4 mg total) by mouth every 8 (eight) hours as needed for nausea or vomiting. 20 tablet 0  . pantoprazole (PROTONIX) 40 MG tablet Take 1 tablet (40 mg total) by mouth daily. 30 tablet 0    Allergies:  Allergies  Allergen Reactions  . Penicillins Rash    Physical Exam:  Temp:  [98.5 F (36.9 C)] 98.5 F (36.9 C) (04/22 1420) Pulse Rate:  [71-78] 78 (04/22 1930) Resp:  [16-20] 16 (04/22 1908) BP: (110-149)/(63-90) 110/63 (04/22 1930) SpO2:  [99 %-100 %] 99 % (04/22 1930) Weight:  [83 kg] 83 kg (04/22 1421)   General Appearance:  Well developed, well nourished, no acute distress, alert and oriented x3 HEENT:  Normocephalic atraumatic, extraocular movements intact, moist mucous membranes Cardiovascular:  regular rate and  rhythm Pulmonary:  Nl effort Abdomen:  soft, nontender, nondistended Skin:  normal coloration and turgor, no rashes, no suspicious skin lesions noted  Psychiatric:  Normal mood and affect, appropriate Pelvic:  deferred   Labs/Studies:   CBC and Coags:  Lab Results  Component Value Date   WBC 5.2 05/30/2019   NEUTOPHILPCT 54.5 08/01/2012   EOSPCT 5.2 08/01/2012   BASOPCT 0.6 08/01/2012   LYMPHOPCT 30.6 08/01/2012   HGB 9.0 (L) 05/30/2019   HCT 30.0 (L) 05/30/2019   MCV 69.9 (L) 05/30/2019   PLT 435 (H) 05/30/2019   CMP:  Lab Results  Component Value Date   NA 137 05/30/2019   K 3.8 05/30/2019   CL 104 05/30/2019   CO2 28 05/30/2019   BUN 10 05/30/2019   CREATININE 0.45 05/30/2019   CREATININE 0.68 10/03/2014   CREATININE 0.77 08/23/2012   PROT 7.1 05/30/2019   BILITOT 0.4 05/30/2019   ALT 28 05/30/2019   AST 22 05/30/2019   ALKPHOS 96 05/30/2019    Other Imaging: US PELVIC COMPLETE WITH TRANSVAGINAL  Result Date: 05/30/2019 CLINICAL DATA:  49 year old female with vaginal bleeding. EXAM: TRANSABDOMINAL AND TRANSVAGINAL ULTRASOUND OF PELVIS TECHNIQUE: Both transabdominal and transvaginal ultrasound examinations of the pelvis were performed. Transabdominal technique was performed for global imaging of the pelvis including uterus, ovaries, adnexal regions, and pelvic cul-de-sac. It was necessary to proceed with endovaginal exam following the transabdominal exam to visualize the endometrium and the ovaries. COMPARISON:  Pelvic ultrasound dated 12/23/2015. FINDINGS: Uterus Measurements: 10.7 x 5.7 x 8.3 cm = volume: 265 mL. The uterus is enlarged, and heterogeneous. There is a 1.8 x 1.3 x 1.5 cm fundal intramural fibroid with submucosal component as well as a 5.1 x 4.7 x 5.0 cm left anterior body intramural fibroid abutting the endometrium. Endometrium Thickness: 24 mm. An intrauterine device is noted which appears slightly lower positioned and malrotated. Right ovary Not  visualized. Left ovary Measurements: 4.8 x 4.3 x 5.7 cm = volume: 61 mL. There is a 3.6 x 3.1 x 5.1 cm left ovarian cyst. Other findings No abnormal free fluid. IMPRESSION: 1. Heterogeneous uterus with 2 fibroids. 2. Intrauterine device appears somewhat malrotated and slightly inferiorly positioned. 3. Nonvisualization of the right ovary. Unremarkable left ovary with a dominant cyst. Electronically Signed   By: Elgie Collard M.D.   On: 05/30/2019 20:27     Assessment / Plan:   Melanie Olson is a 49 y.o. G2P0 who presents with heavy bleeding,  abdominal pain, and a malpositioned IUD.  1. Spoke with pt and her bleeding and pain, introduced her to [facility]. 2. Stable to go home with close follow-up 3. Dr. Leafy Ro aware of pt   Thank you for the opportunity to be involved with this pt's care.   Clydene Laming 05/30/19 9:34 PM

## 2019-05-30 NOTE — ED Notes (Signed)
Pt in ultrasound

## 2019-05-30 NOTE — ED Notes (Signed)
Pt educated that driving under the effects of oxycodone are dangerous. Pt states she will call a ride. Pt educated and gave response for understanding to follow tylenol instructions, if she is going to take it at home.

## 2019-05-30 NOTE — ED Notes (Signed)
Pt states coming in due to vaginal bleeding and cramping since March 15. Pt states she has an IUD and on hormonal birth control. Pt states abdominal pain and cramping that is a 9-10 over the last 6 weeks. Pt states in the last 6 weeks she has gone through 3 boxes of extra strength maxi pads. Pt states she has been talking with per PCP

## 2019-06-01 ENCOUNTER — Emergency Department: Payer: Self-pay

## 2019-06-01 ENCOUNTER — Emergency Department
Admission: EM | Admit: 2019-06-01 | Discharge: 2019-06-01 | Disposition: A | Discharge status: Home / Self Care | Payer: Self-pay | Attending: Emergency Medicine | Admitting: Emergency Medicine

## 2019-06-01 ENCOUNTER — Other Ambulatory Visit: Payer: Self-pay

## 2019-06-01 DIAGNOSIS — R1010 Upper abdominal pain, unspecified: Secondary | ICD-10-CM | POA: Insufficient documentation

## 2019-06-01 LAB — CBC WITH DIFFERENTIAL/PLATELET
Abs Immature Granulocytes: 0.02 10*3/uL (ref 0.00–0.07)
Basophils Absolute: 0 10*3/uL (ref 0.0–0.1)
Basophils Relative: 1 %
Eosinophils Absolute: 0.1 10*3/uL (ref 0.0–0.5)
Eosinophils Relative: 2 %
HCT: 29.1 % — ABNORMAL LOW (ref 36.0–46.0)
Hemoglobin: 8.5 g/dL — ABNORMAL LOW (ref 12.0–15.0)
Immature Granulocytes: 0 %
Lymphocytes Relative: 15 %
Lymphs Abs: 0.9 10*3/uL (ref 0.7–4.0)
MCH: 20.8 pg — ABNORMAL LOW (ref 26.0–34.0)
MCHC: 29.2 g/dL — ABNORMAL LOW (ref 30.0–36.0)
MCV: 71.1 fL — ABNORMAL LOW (ref 80.0–100.0)
Monocytes Absolute: 0.5 10*3/uL (ref 0.1–1.0)
Monocytes Relative: 8 %
Neutro Abs: 4.8 10*3/uL (ref 1.7–7.7)
Neutrophils Relative %: 74 %
Platelets: 356 10*3/uL (ref 150–400)
RBC: 4.09 MIL/uL (ref 3.87–5.11)
RDW: 17 % — ABNORMAL HIGH (ref 11.5–15.5)
WBC: 6.4 10*3/uL (ref 4.0–10.5)
nRBC: 0 % (ref 0.0–0.2)

## 2019-06-01 LAB — COMPREHENSIVE METABOLIC PANEL
ALT: 28 U/L (ref 0–44)
AST: 27 U/L (ref 15–41)
Albumin: 3.3 g/dL — ABNORMAL LOW (ref 3.5–5.0)
Alkaline Phosphatase: 88 U/L (ref 38–126)
Anion gap: 8 (ref 5–15)
BUN: 11 mg/dL (ref 6–20)
CO2: 25 mmol/L (ref 22–32)
Calcium: 8.3 mg/dL — ABNORMAL LOW (ref 8.9–10.3)
Chloride: 105 mmol/L (ref 98–111)
Creatinine, Ser: 0.62 mg/dL (ref 0.44–1.00)
GFR calc Af Amer: >60 mL/min (ref >60)
GFR calc non Af Amer: >60 mL/min (ref >60)
Glucose, Bld: 136 mg/dL — ABNORMAL HIGH (ref 70–99)
Potassium: 3.6 mmol/L (ref 3.5–5.1)
Sodium: 138 mmol/L (ref 135–145)
Total Bilirubin: 0.7 mg/dL (ref 0.3–1.2)
Total Protein: 6.5 g/dL (ref 6.5–8.1)

## 2019-06-01 LAB — LIPASE, BLOOD: Lipase: 28 U/L (ref 11–51)

## 2019-06-01 MED ORDER — MORPHINE SULFATE (PF) 4 MG/ML IV SOLN: 4.0000 mg | Freq: Once | INTRAVENOUS | Status: DC

## 2019-06-01 MED ORDER — IOHEXOL 300 MG/ML  SOLN
100.0000 mL | Freq: Once | INTRAMUSCULAR | Status: AC | PRN
  Administered 2019-06-01: 100 mL via INTRAVENOUS

## 2019-06-01 MED ORDER — OXYCODONE-ACETAMINOPHEN 5-325 MG PO TABS: 1.0000 | ORAL_TABLET | Freq: Three times a day (TID) | ORAL | 0 refills | Status: DC | PRN

## 2019-06-01 MED ORDER — SUCRALFATE 1 G PO TABS: 1.0000 g | ORAL_TABLET | Freq: Four times a day (QID) | ORAL | 1 refills | Status: DC

## 2019-06-01 MED ORDER — OXYCODONE-ACETAMINOPHEN 5-325 MG PO TABS
2.0000 | ORAL_TABLET | Freq: Once | ORAL | Status: AC
  Administered 2019-06-01: 2 via ORAL
  Filled 2019-06-01: qty 2

## 2019-06-01 MED ORDER — HYDROMORPHONE HCL 1 MG/ML IJ SOLN
1.0000 mg | Freq: Once | INTRAMUSCULAR | Status: AC
  Administered 2019-06-01: 1 mg via INTRAVENOUS
  Filled 2019-06-01: qty 1

## 2019-06-01 MED ORDER — EQL SLOW RELEASE IRON 160 (50 FE) MG PO TBCR: 160.0000 mg | EXTENDED_RELEASE_TABLET | ORAL | 2 refills | Status: DC

## 2019-06-01 MED ORDER — PANTOPRAZOLE SODIUM 40 MG PO TBEC: 40.0000 mg | DELAYED_RELEASE_TABLET | Freq: Two times a day (BID) | ORAL | 0 refills | Status: DC

## 2019-06-01 NOTE — ED Notes (Signed)
Pt presents to ED via POV, states seen 2 days ago and dx with fibroids that were pushing on her IUD. Pt states continued lower abdominal/pelvic pain since then. Pt states has been taking Tylenol and Oxycodone with minimal to no relief since then. Pt presents alert and oriented. Denies any vaginal bleeding at this time. Pt A&O x4.

## 2019-06-01 NOTE — ED Provider Notes (Signed)
[facility] Emergency Department Provider Note       Time seen: ----------------------------------------- 3:09 PM on 06/01/2019 -----------------------------------------   I have reviewed the triage vital signs and the nursing notes.  HISTORY   Chief Complaint Abdominal Pain    HPI Melanie Olson is a 49 y.o. female with a history of stomach ulcer, depression, migraines who presents to the ED for worsening upper abdominal pain.  Patient states Melanie Olson was here recently for same.  Patient states the pain is so severe and medication helps briefly but then the pain recurs.  Melanie Olson does not drink alcohol.  Melanie Olson is currently taking Protonix.  Melanie Olson has not had vomiting or diarrhea.  Discomfort is 8 out of 10.  Past Medical History:  Diagnosis Date  . Abdominal wall ulcer (HCC)   . Depression   . HSV (herpes simplex virus) infection   . Migraine     There are no problems to display for this patient.   History reviewed. No pertinent surgical history.  Allergies Penicillins  Social History Social History   Tobacco Use  . Smoking status: Never Smoker  Substance Use Topics  . Alcohol use: No  . Drug use: No    Review of Systems Constitutional: Negative for fever. Cardiovascular: Negative for chest pain. Respiratory: Negative for shortness of breath. Gastrointestinal: Positive for abdominal pain Musculoskeletal: Positive for back pain Skin: Negative for rash. Neurological: Negative for headaches, focal weakness or numbness.  All systems negative/normal/unremarkable except as stated in the HPI  ____________________________________________   PHYSICAL EXAM:  VITAL SIGNS: ED Triage Vitals [06/01/19 1437]  Enc Vitals Group     BP 138/79     Pulse Rate 84     Resp 18     Temp 98.3 F (36.8 C)     Temp src      SpO2 100 %     Weight 183 lb (83 kg)     Height 5\' 2"  (1.575 m)     Head Circumference      Peak Flow      Pain Score 8     Pain Loc       Pain Edu?      Excl. in GC?     Constitutional: Alert and oriented.  Mild distress Eyes: Conjunctivae are normal. Normal extraocular movements. Cardiovascular: Normal rate, regular rhythm. No murmurs, rubs, or gallops. Respiratory: Normal respiratory effort without tachypnea nor retractions. Breath sounds are clear and equal bilaterally. No wheezes/rales/rhonchi. Gastrointestinal: Epigastric tenderness, no rebound or guarding.  Normal bowel sounds. Musculoskeletal: Nontender with normal range of motion in extremities. No lower extremity tenderness nor edema. Neurologic:  Normal speech and language. No gross focal neurologic deficits are appreciated.  Skin:  Skin is warm, dry and intact. No rash noted. Psychiatric: Mood and affect are normal. Speech and behavior are normal.  ____________________________________________  ED COURSE:  As part of my medical decision making, I reviewed the following data within the electronic MEDICAL RECORD NUMBER History obtained from family if available, nursing notes, old chart and ekg, as well as notes from prior ED visits. Patient presented for upper abdominal pain, we will assess with labs and imaging as indicated at this time.   Procedures  Melanie Olson was evaluated in Emergency Department on 06/01/2019 for the symptoms described in the history of present illness. Melanie Olson was evaluated in the context of the global COVID-19 pandemic, which necessitated consideration that the patient might be at risk for infection with the  SARS-CoV-2 virus that causes COVID-19. Institutional protocols and algorithms that pertain to the evaluation of patients at risk for COVID-19 are in a state of rapid change based on information released by regulatory bodies including the CDC and federal and state organizations. These policies and algorithms were followed during the patient's care in the ED.  ____________________________________________   LABS (pertinent  positives/negatives)  Labs Reviewed  CBC WITH DIFFERENTIAL/PLATELET - Abnormal; Notable for the following components:      Result Value   Hemoglobin 8.5 (*)    HCT 29.1 (*)    MCV 71.1 (*)    MCH 20.8 (*)    MCHC 29.2 (*)    RDW 17.0 (*)    All other components within normal limits  COMPREHENSIVE METABOLIC PANEL - Abnormal; Notable for the following components:   Glucose, Bld 136 (*)    Calcium 8.3 (*)    Albumin 3.3 (*)    All other components within normal limits  LIPASE, BLOOD  URINALYSIS, COMPLETE (UACMP) WITH MICROSCOPIC    RADIOLOGY Images were viewed by me  CT of the abdomen pelvis with contrast IMPRESSION:  1. 5 x 4 cm area of ill-defined low-attenuation in the left aspect  of the myometrium of the uterus. This could be a degenerated fibroid  based on the ultrasound.  2. IUD in place with moderate fluid in the endometrial cavity. Could  not exclude endometritis.  3. 5 cm simple appearing cyst associated with the left ovary.  4. Recommend gyn consultation. Pelvic MRI may be helpful for further  evaluation if clinically indicated.  5. Status post gastric bypass surgery. No complicating features are  identified.  6. Status post cholecystectomy with mild not unexpected common bile  duct dilatation.  ____________________________________________   DIFFERENTIAL DIAGNOSIS   Gastritis, gastroenteritis, peptic ulcer disease, cholecystitis, biliary colic, dehydration  FINAL ASSESSMENT AND PLAN  Epigastric abdominal pain, iron deficiency anemia   Plan: The patient had presented for upper abdominal pain. Patient's labs did indicate a worsening anemia which is likely iron deficiency rather than B12 or intrinsic factor related. Patient's imaging did not reveal any acute process.  Melanie Olson will need outpatient endoscopy, I will refer to GI for same.  I will double her antacid and Carafate.  Melanie Olson is cleared for outpatient follow-up.   Laurence Aly, MD    Note: This  note was generated in part or whole with voice recognition software. Voice recognition is usually quite accurate but there are transcription errors that can and very often do occur. I apologize for any typographical errors that were not detected and corrected.     Earleen Newport, MD 06/01/19 [phone]

## 2019-06-01 NOTE — ED Triage Notes (Signed)
Pt comes via POV from home with c/o abdominal pain. Pt states she was just here for same.  Pt states the pain is just so severe and she is taking way too much medicine.

## 2019-06-01 NOTE — ED Notes (Signed)
This RN and EDP to bedside at this time, rectal exam performed by EDP, hemoccult negative at this time.

## 2019-06-01 NOTE — ED Notes (Signed)
Pt taken directly to room 1, protocols not performed

## 2019-06-01 NOTE — ED Triage Notes (Signed)
FIRST NURSE NOTE: Pt c/o continued abd pain, seen here recently for the same, pain is not improving.

## 2019-06-26 ENCOUNTER — Ambulatory Visit: Payer: Self-pay | Admitting: Gastroenterology

## 2019-06-27 ENCOUNTER — Other Ambulatory Visit: Payer: Self-pay

## 2019-06-27 ENCOUNTER — Ambulatory Visit (INDEPENDENT_AMBULATORY_CARE_PROVIDER_SITE_OTHER): Payer: Self-pay | Admitting: Gastroenterology

## 2019-06-27 ENCOUNTER — Encounter: Payer: Self-pay | Admitting: Gastroenterology

## 2019-06-27 VITALS — BP 118/74 | HR 99 | Temp 98.0°F | Ht 62.0 in | Wt 188.8 lb

## 2019-06-27 DIAGNOSIS — Z1211 Encounter for screening for malignant neoplasm of colon: Secondary | ICD-10-CM

## 2019-06-27 DIAGNOSIS — D509 Iron deficiency anemia, unspecified: Secondary | ICD-10-CM

## 2019-06-27 NOTE — Patient Instructions (Signed)
Please take Miralax 17 G daily for constipation.  Low-FODMAP Eating Plan  FODMAPs (fermentable oligosaccharides, disaccharides, monosaccharides, and polyols) are sugars that are hard for some people to digest. A low-FODMAP eating plan may help some people who have bowel (intestinal) diseases to manage their symptoms. This meal plan can be complicated to follow. Work with a diet and nutrition specialist (dietitian) to make a low-FODMAP eating plan that is right for you. A dietitian can make sure that you get enough nutrition from this diet. What are tips for following this plan? Reading food labels  Check labels for hidden FODMAPs such as: ? High-fructose syrup. ? Honey. ? Agave. ? Natural fruit flavors. ? Onion or garlic powder.  Choose low-FODMAP foods that contain 3-4 grams of fiber per serving.  Check food labels for serving sizes. Eat only one serving at a time to make sure FODMAP levels stay low. Meal planning  Follow a low-FODMAP eating plan for up to 6 weeks, or as told by your health care provider or dietitian.  To follow the eating plan: 1. Eliminate high-FODMAP foods from your diet completely. 2. Gradually reintroduce high-FODMAP foods into your diet one at a time. Most people should wait a few days after introducing one high-FODMAP food before they introduce the next high-FODMAP food. Your dietitian can recommend how quickly you may reintroduce foods. 3. Keep a daily record of what you eat and drink, and make note of any symptoms that you have after eating. 4. Review your daily record with a dietitian regularly. Your dietitian can help you identify which foods you can eat and which foods you should avoid. General tips  Drink enough fluid each day to keep your urine pale yellow.  Avoid processed foods. These often have added sugar and may be high in FODMAPs.  Avoid most dairy products, whole grains, and sweeteners.  Work with a dietitian to make sure you get enough fiber  in your diet. Recommended foods Grains  Gluten-free grains, such as rice, oats, buckwheat, quinoa, corn, polenta, and millet. Gluten-free pasta, bread, or cereal. Rice noodles. Corn tortillas. Vegetables  Eggplant, zucchini, cucumber, peppers, green beans, Brussels sprouts, bean sprouts, lettuce, arugula, kale, Swiss chard, spinach, collard greens, bok choy, summer squash, potato, and tomato. Limited amounts of corn, carrot, and sweet potato. Green parts of scallions. Fruits  Bananas, oranges, lemons, limes, blueberries, raspberries, strawberries, grapes, cantaloupe, honeydew melon, kiwi, papaya, passion fruit, and pineapple. Limited amounts of dried cranberries, banana chips, and shredded coconut. Dairy  Lactose-free milk, yogurt, and kefir. Lactose-free cottage cheese and ice cream. Non-dairy milks, such as almond, coconut, hemp, and rice milk. Yogurts made of non-dairy milks. Limited amounts of goat cheese, brie, mozzarella, parmesan, swiss, and other hard cheeses. Meats and other protein foods  Unseasoned beef, pork, poultry, or fish. Eggs. Berniece Salines. Tofu (firm) and tempeh. Limited amounts of nuts and seeds, such as almonds, walnuts, [place] nuts, pecans, peanuts, pumpkin seeds, chia seeds, and sunflower seeds. Fats and oils  Butter-free spreads. Vegetable oils, such as olive, canola, and sunflower oil. Seasoning and other foods  Artificial sweeteners with names that do not end in "ol" such as aspartame, saccharine, and stevia. Maple syrup, white table sugar, raw sugar, brown sugar, and molasses. Fresh basil, coriander, parsley, rosemary, and thyme. Beverages  Water and mineral water. Sugar-sweetened soft drinks. Small amounts of orange juice or cranberry juice. Black and green tea. Most dry wines. Coffee. This may not be a complete list of low-FODMAP foods. Talk with your dietitian  for more information. Foods to avoid Grains  Wheat, including kamut, durum, and semolina. Barley and  bulgur. Couscous. Wheat-based cereals. Wheat noodles, bread, crackers, and pastries. Vegetables  Chicory root, artichoke, asparagus, cabbage, snow peas, sugar snap peas, mushrooms, and cauliflower. Onions, garlic, leeks, and the white part of scallions. Fruits  Fresh, dried, and juiced forms of apple, pear, watermelon, peach, plum, cherries, apricots, blackberries, boysenberries, figs, nectarines, and mango. Avocado. Dairy  Milk, yogurt, ice cream, and soft cheese. Cream and sour cream. Milk-based sauces. Custard. Meats and other protein foods  Fried or fatty meat. Sausage. Cashews and pistachios. Soybeans, baked beans, black beans, chickpeas, kidney beans, fava beans, navy beans, lentils, and split peas. Seasoning and other foods  Any sugar-free gum or candy. Foods that contain artificial sweeteners such as sorbitol, mannitol, isomalt, or xylitol. Foods that contain honey, high-fructose corn syrup, or agave. Bouillon, vegetable stock, beef stock, and chicken stock. Garlic and onion powder. Condiments made with onion, such as hummus, chutney, pickles, relish, salad dressing, and salsa. Tomato paste. Beverages  Chicory-based drinks. Coffee substitutes. Chamomile tea. Fennel tea. Sweet or fortified wines such as port or sherry. Diet soft drinks made with isomalt, mannitol, maltitol, sorbitol, or xylitol. Apple, pear, and mango juice. Juices with high-fructose corn syrup. This may not be a complete list of high-FODMAP foods. Talk with your dietitian to discuss what dietary choices are best for you.  Summary  A low-FODMAP eating plan is a short-term diet that eliminates FODMAPs from your diet to help ease symptoms of certain bowel diseases.  The eating plan usually lasts up to 6 weeks. After that, high-FODMAP foods are restarted gradually, one at a time, so you can find out which may be causing symptoms.  A low-FODMAP eating plan can be complicated. It is best to work with a dietitian who has  experience with this type of plan. This information is not intended to replace advice given to you by your health care provider. Make sure you discuss any questions you have with your health care provider. Document Revised: 01/06/2017 Document Reviewed: 09/20/2016 Elsevier Patient Education  2020 [employer].

## 2019-06-28 NOTE — Progress Notes (Signed)
[name] [address]  [address]  [place], [place] [address]  Main: [phone]  Fax: [phone]   Gastroenterology Consultation  Referring Provider:     Center, [facility]* Primary Care Physician:  Center, [facility] Reason for Consultation:   Abdominal pain        HPI:    Chief Complaint  Patient presents with  . New Patient (Initial Visit)  . Hospitalization Follow-up  . Abdominal Pain    Patient stated that she went to the ED for abdominal pain and was told she had ulcers.    Melanie Olson is a 49 y.o. y/o female referred for consultation & management  by Dr. Eli Phillips, [facility].  Patient with history of gastric bypass in 2006 who went to the ER with abdominal pain recently.  Epigastric, nonradiating, 5/10, sharp.  She was prescribed antacids and Carafate and discharged.  Patient had an upper endoscopy in June 2014 with the following results by Dr. Marva Panda    Patient also had an EGD in 2009 by Dr. Servando Snare which showed a normal anastomosis.  An erosion in the small bowel, just distal to the anastomosis was reported.  Patient denies any NSAID use.  No blood in stool.  No prior colonoscopy.  Past Medical History:  Diagnosis Date  . Abdominal wall ulcer (HCC)   . Depression   . HSV (herpes simplex virus) infection   . Migraine     History reviewed. No pertinent surgical history.  Prior to Admission medications   Medication Sig Start Date End Date Taking? Authorizing Provider  acyclovir (ZOVIRAX) 200 MG capsule Take 2 capsules by mouth in the morning and at bedtime.   Yes [provider]  ferrous sulfate (EQL SLOW RELEASE IRON) 160 (50 Fe) MG TBCR SR tablet Take 1 tablet (160 mg total) by mouth every other day. 06/01/19  Yes Emily Filbert, MD  pantoprazole (PROTONIX) 40 MG tablet Take 1 tablet (40 mg total) by mouth 2 (two) times daily. 06/01/19  Yes Emily Filbert, MD    History  reviewed. No pertinent family history.   Social History   Tobacco Use  . Smoking status: Never Smoker  . Smokeless tobacco: Never Used  Substance Use Topics  . Alcohol use: No  . Drug use: No    Allergies as of 06/27/2019 - Review Complete 06/27/2019  Allergen Reaction Noted  . Penicillins Rash 10/03/2014    Review of Systems:    All systems reviewed and negative except where noted in HPI.   Physical Exam:  BP 118/74   Pulse 99   Temp 98 F (36.7 C) (Oral)   Ht 5\' 2"  (1.575 m)   Wt 188 lb 12.8 oz (85.6 kg)   BMI 34.53 kg/m  No LMP recorded. Psych:  Alert and cooperative. Normal mood and affect. General:   Alert,  Well-developed, well-nourished, pleasant and cooperative in NAD Head:  Normocephalic and atraumatic. Eyes:  Sclera clear, no icterus.   Conjunctiva pink. Ears:  Normal auditory acuity. Nose:  No deformity, discharge, or lesions. Mouth:  No deformity or lesions,oropharynx pink & moist. Neck:  Supple; no masses or thyromegaly. Abdomen:  Normal bowel sounds.  No bruits.  Soft, non-tender and non-distended without masses, hepatosplenomegaly or hernias noted.  No guarding or rebound tenderness.    Msk:  Symmetrical without gross deformities. Good, equal movement & strength bilaterally. Pulses:  Normal pulses noted. Extremities:  No clubbing or edema.  No cyanosis. Neurologic:  Alert and oriented x3;  grossly normal neurologically. Skin:  Intact without significant lesions or rashes. No jaundice. Lymph Nodes:  No significant cervical adenopathy. Psych:  Alert and cooperative. Normal mood and affect.   Labs: CBC    Component Value Date/Time   WBC 6.4 06/01/2019 1448   RBC 4.09 06/01/2019 1448   HGB 8.5 (L) 06/01/2019 1448   HGB 10.9 (L) 08/23/2012 0628   HCT 29.1 (L) 06/01/2019 1448   HCT 33.7 (L) 08/23/2012 0628   PLT 356 06/01/2019 1448   PLT 325 08/23/2012 0628   MCV 71.1 (L) 06/01/2019 1448   MCV 79 (L) 08/23/2012 0628   MCH 20.8 (L) 06/01/2019 1448    MCHC 29.2 (L) 06/01/2019 1448   RDW 17.0 (H) 06/01/2019 1448   RDW 15.2 (H) 08/23/2012 0628   LYMPHSABS 0.9 06/01/2019 1448   LYMPHSABS 1.1 08/01/2012 0415   MONOABS 0.5 06/01/2019 1448   MONOABS 0.3 08/01/2012 0415   EOSABS 0.1 06/01/2019 1448   EOSABS 0.2 08/01/2012 0415   BASOSABS 0.0 06/01/2019 1448   BASOSABS 0.0 08/01/2012 0415   CMP     Component Value Date/Time   NA 138 06/01/2019 1448   NA 141 08/23/2012 0628   K 3.6 06/01/2019 1448   K 3.6 08/23/2012 0628   CL 105 06/01/2019 1448   CL 108 (H) 08/23/2012 0628   CO2 25 06/01/2019 1448   CO2 26 08/23/2012 0628   GLUCOSE 136 (H) 06/01/2019 1448   GLUCOSE 93 08/23/2012 0628   BUN 11 06/01/2019 1448   BUN 12 08/23/2012 0628   CREATININE 0.62 06/01/2019 1448   CREATININE 0.77 08/23/2012 0628   CALCIUM 8.3 (L) 06/01/2019 1448   CALCIUM 8.6 08/23/2012 0628   PROT 6.5 06/01/2019 1448   PROT 7.2 08/23/2012 0628   ALBUMIN 3.3 (L) 06/01/2019 1448   ALBUMIN 3.5 08/23/2012 0628   AST 27 06/01/2019 1448   AST 17 08/23/2012 0628   ALT 28 06/01/2019 1448   ALT 20 08/23/2012 0628   ALKPHOS 88 06/01/2019 1448   ALKPHOS 105 08/23/2012 0628   BILITOT 0.7 06/01/2019 1448   BILITOT 0.5 08/23/2012 0628   GFRNONAA >60 06/01/2019 1448   GFRNONAA >60 08/23/2012 0628   GFRAA >60 06/01/2019 1448   GFRAA >60 08/23/2012 0628    Imaging Studies: CT ABDOMEN PELVIS W CONTRAST  Result Date: 06/01/2019 CLINICAL DATA:  Persistent abdominal pain. History of gastric bypass surgery. EXAM: CT ABDOMEN AND PELVIS WITH CONTRAST TECHNIQUE: Multidetector CT imaging of the abdomen and pelvis was performed using the standard protocol following bolus administration of intravenous contrast. CONTRAST:  OMNIPAQUE IOHEXOL 300 MG/ML  SOLN COMPARISON:  CT scan from 2014 and pelvic ultrasound from 2017 FINDINGS: Lower chest: The lung bases are clear of an acute process. No pulmonary lesions or pleural effusions. The heart is normal in size. There is a  stable small epicardial cyst on the right. Hepatobiliary: No focal hepatic lesions or intrahepatic biliary dilatation. The gallbladder is surgically absent. Stable mild associated common bile duct dilatation. Pancreas: No mass, inflammation or ductal dilatation. Spleen: Normal size.  No focal lesions. Adrenals/Urinary Tract: The adrenal glands and kidneys are unremarkable and stable. Bilateral parapelvic renal cysts are noted. No worrisome renal lesions. The bladder is unremarkable. Stomach/Bowel: Surgical changes from gastric bypass surgery. No complicating features are identified. No obstruction or internal hernia. The small bowel is unremarkable. No findings for obstruction. The terminal ileum is normal. The appendix is not  identified for certain but no findings for acute appendicitis. There is moderate stool in the colon. Vascular/Lymphatic: The aorta is normal in caliber. No dissection. The branch vessels are patent. The major venous structures are patent. No mesenteric or retroperitoneal mass or adenopathy. Small scattered lymph nodes are noted. Reproductive: The uterus is enlarged. There is a 5 x 4 cm area of low attenuation in the left aspect of the uterus. It has somewhat ill-defined borders but appear to be a degenerated fibroid on the previous pelvic ultrasound from 2 days ago. No prior fibroid was seen on the prior studies. There is also an IUD in place and there is moderate fluid in the endometrial cavity. Could not exclude endometritis. 4.8 cm simple appearing cyst associated with the left ovary. The right ovary appears normal. Other: No free pelvic fluid collections. No inguinal mass or adenopathy. No pelvic adenopathy. Musculoskeletal: No significant bony findings. IMPRESSION: 1. 5 x 4 cm area of ill-defined low-attenuation in the left aspect of the myometrium of the uterus. This could be a degenerated fibroid based on the ultrasound. 2. IUD in place with moderate fluid in the endometrial cavity.  Could not exclude endometritis. 3. 5 cm simple appearing cyst associated with the left ovary. 4. Recommend gyn consultation. Pelvic MRI may be helpful for further evaluation if clinically indicated. 5. Status post gastric bypass surgery. No complicating features are identified. 6. Status post cholecystectomy with mild not unexpected common bile duct dilatation. Electronically Signed   By: Rudie Meyer M.D.   On: 06/01/2019 17:37   US PELVIC COMPLETE WITH TRANSVAGINAL  Result Date: 05/30/2019 CLINICAL DATA:  49 year old female with vaginal bleeding. EXAM: TRANSABDOMINAL AND TRANSVAGINAL ULTRASOUND OF PELVIS TECHNIQUE: Both transabdominal and transvaginal ultrasound examinations of the pelvis were performed. Transabdominal technique was performed for global imaging of the pelvis including uterus, ovaries, adnexal regions, and pelvic cul-de-sac. It was necessary to proceed with endovaginal exam following the transabdominal exam to visualize the endometrium and the ovaries. COMPARISON:  Pelvic ultrasound dated 12/23/2015. FINDINGS: Uterus Measurements: 10.7 x 5.7 x 8.3 cm = volume: 265 mL. The uterus is enlarged, and heterogeneous. There is a 1.8 x 1.3 x 1.5 cm fundal intramural fibroid with submucosal component as well as a 5.1 x 4.7 x 5.0 cm left anterior body intramural fibroid abutting the endometrium. Endometrium Thickness: 24 mm. An intrauterine device is noted which appears slightly lower positioned and malrotated. Right ovary Not visualized. Left ovary Measurements: 4.8 x 4.3 x 5.7 cm = volume: 61 mL. There is a 3.6 x 3.1 x 5.1 cm left ovarian cyst. Other findings No abnormal free fluid. IMPRESSION: 1. Heterogeneous uterus with 2 fibroids. 2. Intrauterine device appears somewhat malrotated and slightly inferiorly positioned. 3. Nonvisualization of the right ovary. Unremarkable left ovary with a dominant cyst. Electronically Signed   By: Elgie Collard M.D.   On: 05/30/2019 20:27    Assessment and Plan:    OPLE GIRGIS is a 49 y.o. y/o female has been referred for abdominal pain and iron deficiency anemia with history of gastric bypass  No signs of active GI bleeding Iron deficiency likely due to gastric bypass.  However, given previous ulceration seen on upper endoscopy in 2014, repeat upper endoscopy at this time  Also due for screening colonoscopy  I have discussed alternative options, risks & benefits,  which include, but are not limited to, bleeding, infection, perforation,respiratory complication & drug reaction.  The patient agrees with this plan & written consent will be obtained.  Avoid NSAID use such as Ibuprofen, Aleeve, advil, motrin, BC and Goodie powder, Naproxen, Meloxicam and others.     Dr [name]  Speech recognition software was used to dictate the above note.

## 2019-07-09 ENCOUNTER — Other Ambulatory Visit: Payer: Self-pay

## 2019-07-09 ENCOUNTER — Other Ambulatory Visit
Admission: RE | Admit: 2019-07-09 | Discharge: 2019-07-09 | Disposition: A | Discharge status: Home / Self Care | Payer: HRSA Program | Source: Ambulatory Visit | Attending: Gastroenterology | Admitting: Gastroenterology

## 2019-07-09 DIAGNOSIS — Z01812 Encounter for preprocedural laboratory examination: Secondary | ICD-10-CM | POA: Diagnosis present

## 2019-07-09 DIAGNOSIS — Z20822 Contact with and (suspected) exposure to covid-19: Secondary | ICD-10-CM | POA: Insufficient documentation

## 2019-07-10 ENCOUNTER — Encounter: Payer: Self-pay | Admitting: Gastroenterology

## 2019-07-10 LAB — SARS CORONAVIRUS 2 (TAT 6-24 HRS): SARS Coronavirus 2: NEGATIVE

## 2019-07-11 ENCOUNTER — Encounter: Payer: Self-pay | Admitting: Gastroenterology

## 2019-07-11 ENCOUNTER — Encounter
Admission: RE | Disposition: A | Discharge status: Home / Self Care | Payer: Self-pay | Source: Ambulatory Visit | Attending: Gastroenterology

## 2019-07-11 ENCOUNTER — Other Ambulatory Visit: Payer: Self-pay

## 2019-07-11 ENCOUNTER — Ambulatory Visit
Admission: RE | Admit: 2019-07-11 | Discharge: 2019-07-11 | Disposition: A | Discharge status: Home / Self Care | Payer: Self-pay | Source: Ambulatory Visit | Attending: Gastroenterology | Admitting: Gastroenterology

## 2019-07-11 ENCOUNTER — Ambulatory Visit: Payer: Self-pay | Admitting: Certified Registered"

## 2019-07-11 DIAGNOSIS — K219 Gastro-esophageal reflux disease without esophagitis: Secondary | ICD-10-CM | POA: Insufficient documentation

## 2019-07-11 DIAGNOSIS — Z9884 Bariatric surgery status: Secondary | ICD-10-CM | POA: Insufficient documentation

## 2019-07-11 DIAGNOSIS — Z1211 Encounter for screening for malignant neoplasm of colon: Secondary | ICD-10-CM | POA: Insufficient documentation

## 2019-07-11 DIAGNOSIS — R109 Unspecified abdominal pain: Secondary | ICD-10-CM | POA: Insufficient documentation

## 2019-07-11 DIAGNOSIS — Z880 Allergy status to penicillin: Secondary | ICD-10-CM | POA: Insufficient documentation

## 2019-07-11 DIAGNOSIS — D509 Iron deficiency anemia, unspecified: Secondary | ICD-10-CM | POA: Insufficient documentation

## 2019-07-11 DIAGNOSIS — Z980 Intestinal bypass and anastomosis status: Secondary | ICD-10-CM | POA: Insufficient documentation

## 2019-07-11 HISTORY — DX: Gastro-esophageal reflux disease without esophagitis: K21.9

## 2019-07-11 HISTORY — PX: COLONOSCOPY WITH PROPOFOL: SHX5780

## 2019-07-11 HISTORY — PX: ESOPHAGOGASTRODUODENOSCOPY (EGD) WITH PROPOFOL: SHX5813

## 2019-07-11 LAB — POCT PREGNANCY, URINE: Preg Test, Ur: NEGATIVE

## 2019-07-11 SURGERY — COLONOSCOPY WITH PROPOFOL
Anesthesia: General

## 2019-07-11 MED ORDER — SODIUM CHLORIDE 0.9 % IV SOLN
INTRAVENOUS | Status: DC
  Administered 2019-07-11: 1000 mL via INTRAVENOUS

## 2019-07-11 MED ORDER — PROPOFOL 10 MG/ML IV BOLUS
INTRAVENOUS | Status: DC | PRN
  Administered 2019-07-11: 10 mg via INTRAVENOUS
  Administered 2019-07-11: 40 mg via INTRAVENOUS

## 2019-07-11 MED ORDER — GLYCOPYRROLATE 0.2 MG/ML IJ SOLN
INTRAMUSCULAR | Status: DC | PRN
  Administered 2019-07-11: .2 mg via INTRAVENOUS

## 2019-07-11 MED ORDER — PROPOFOL 500 MG/50ML IV EMUL
INTRAVENOUS | Status: DC | PRN
  Administered 2019-07-11: 155 ug/kg/min via INTRAVENOUS

## 2019-07-11 MED ORDER — LIDOCAINE HCL (CARDIAC) PF 100 MG/5ML IV SOSY
PREFILLED_SYRINGE | INTRAVENOUS | Status: DC | PRN
  Administered 2019-07-11: 100 mg via INTRAVENOUS

## 2019-07-11 MED ORDER — MIDAZOLAM HCL 2 MG/2ML IJ SOLN
INTRAMUSCULAR | Status: DC | PRN
  Administered 2019-07-11 (×2): 1 mg via INTRAVENOUS

## 2019-07-11 NOTE — Anesthesia Preprocedure Evaluation (Addendum)
Anesthesia Evaluation  Patient identified by MRN, date of birth, ID band Patient awake    Reviewed: Allergy & Precautions, H&P , NPO status , Patient's Chart, lab work & pertinent test results  Airway Mallampati: II  TM Distance: >3 FB Neck ROM: full    Dental  (+) Teeth Intact   Pulmonary neg pulmonary ROS, neg COPD, Not current smoker,    breath sounds clear to auscultation       Cardiovascular (-) angina(-) Past MI negative cardio ROS   Rhythm:regular Rate:Normal     Neuro/Psych  Headaches, PSYCHIATRIC DISORDERS Depression    GI/Hepatic Neg liver ROS, GERD  ,  Endo/Other  negative endocrine ROS  Renal/GU negative Renal ROS  negative genitourinary   Musculoskeletal   Abdominal   Peds  Hematology negative hematology ROS (+)   Anesthesia Other Findings Past Medical History: No date: Abdominal wall ulcer (HCC) No date: Depression No date: GERD (gastroesophageal reflux disease) No date: HSV (herpes simplex virus) infection No date: Migraine  Past Surgical History: No date: CHOLECYSTECTOMY 2006: GASTRIC BYPASS No date: tummy tuck  BMI    Body Mass Index: 33.29 kg/m      Reproductive/Obstetrics negative OB ROS                            Anesthesia Physical Anesthesia Plan  ASA: II  Anesthesia Plan: General   Post-op Pain Management:    Induction:   PONV Risk Score and Plan: Propofol infusion and TIVA  Airway Management Planned: Natural Airway and Nasal Cannula  Additional Equipment:   Intra-op Plan:   Post-operative Plan:   Informed Consent: I have reviewed the patients History and Physical, chart, labs and discussed the procedure including the risks, benefits and alternatives for the proposed anesthesia with the patient or authorized representative who has indicated his/her understanding and acceptance.     Dental Advisory Given  Plan Discussed with:  Anesthesiologist, CRNA and Surgeon  Anesthesia Plan Comments:        Anesthesia Quick Evaluation

## 2019-07-11 NOTE — Transfer of Care (Signed)
Immediate Anesthesia Transfer of Care Note  Patient: Melanie Olson  Procedure(s) Performed: COLONOSCOPY WITH PROPOFOL (N/A ) ESOPHAGOGASTRODUODENOSCOPY (EGD) WITH PROPOFOL (N/A )  Patient Location: Endoscopy Unit  Anesthesia Type:General  Level of Consciousness: drowsy  Airway & Oxygen Therapy: Patient Spontanous Breathing and Patient connected to face mask oxygen  Post-op Assessment: Report given to RN and Post -op Vital signs reviewed and stable  Post vital signs: Reviewed and stable  Last Vitals:  Vitals Value Taken Time  BP 114/60 07/11/19 1046  Temp    Pulse 81 07/11/19 1047  Resp 16 07/11/19 1047  SpO2 100 % 07/11/19 1047  Vitals shown include unvalidated device data.  Last Pain:  Vitals:   07/11/19 0926  TempSrc: Tympanic  PainSc: 0-No pain         Complications: No apparent anesthesia complications

## 2019-07-11 NOTE — Anesthesia Postprocedure Evaluation (Signed)
Anesthesia Post Note  Patient: BRAYLEN DENUNZIO  Procedure(s) Performed: COLONOSCOPY WITH PROPOFOL (N/A ) ESOPHAGOGASTRODUODENOSCOPY (EGD) WITH PROPOFOL (N/A )  Patient location during evaluation: PACU Anesthesia Type: General Level of consciousness: awake and alert Pain management: pain level controlled Vital Signs Assessment: post-procedure vital signs reviewed and stable Respiratory status: spontaneous breathing, nonlabored ventilation and respiratory function stable Cardiovascular status: blood pressure returned to baseline and stable Postop Assessment: no apparent nausea or vomiting Anesthetic complications: no     Last Vitals:  Vitals:   07/11/19 0926 07/11/19 1046  BP: 108/89   Pulse: 89   Resp: 16   Temp: 36.5 C (!) 36.1 C  SpO2: 100%     Last Pain:  Vitals:   07/11/19 1116  TempSrc:   PainSc: 0-No pain                 Karleen Hampshire

## 2019-07-11 NOTE — Op Note (Signed)
[facility] Gastroenterology Patient Name: Melanie Olson Procedure Date: 07/11/2019 9:57 AM MRN: [id number] Account #: [id number] Date of Birth: 02-03-1971 Admit Type: Outpatient Age: 49 Room: [facility] ENDO ROOM 2 Gender: Female Note Status: Finalized Procedure:             Upper GI endoscopy Indications:           Iron deficiency anemia Providers:             Hallel Denherder B. Maximino Greenland MD, MD Medicines:             Monitored Anesthesia Care Complications:         No immediate complications. Procedure:             Pre-Anesthesia Assessment:                        - The risks and benefits of the procedure and the                         sedation options and risks were discussed with the                         patient. All questions were answered and informed                         consent was obtained.                        - Patient identification and proposed procedure were                         verified prior to the procedure.                        - ASA Grade Assessment: II - A patient with mild                         systemic disease.                        After obtaining informed consent, the endoscope was                         passed under direct vision. Throughout the procedure,                         the patient's blood pressure, pulse, and oxygen                         saturations were monitored continuously. The Endoscope                         was introduced through the mouth, and advanced to the                         jejunum. The upper GI endoscopy was accomplished with                         ease. The patient tolerated the procedure well. Findings:      The examined esophagus was normal.  Evidence of a Roux-en-Y gastrojejunostomy was found. The gastrojejunal       anastomosis was characterized by healthy appearing mucosa, an intact       staple line and visible sutures. This was traversed. The       pouch-to-jejunum limb was characterized  by healthy appearing mucosa.      The entire examined stomach was normal.      The examined jejunum was normal. Impression:            - Normal esophagus.                        - Roux-en-Y gastrojejunostomy with gastrojejunal                         anastomosis characterized by healthy appearing mucosa,                         an intact staple line and visible sutures.                        - Normal stomach.                        - Normal examined jejunum.                        - No specimens collected. Recommendation:        - Continue present medications.                        - Return to my office in 4 weeks.                        - The findings and recommendations were discussed with                         the patient.                        - The findings and recommendations were discussed with                         the patient's family. Procedure Code(s):     --- Professional ---                        [phone], Esophagogastroduodenoscopy, flexible,                         transoral; diagnostic, including collection of                         specimen(s) by brushing or washing, when performed                         (separate procedure) Diagnosis Code(s):     --- Professional ---                        Z98.0, Intestinal bypass and anastomosis status                        D50.9,  Iron deficiency anemia, unspecified CPT copyright 2019 American Medical Association. All rights reserved. The codes documented in this report are preliminary and upon coder review may  be revised to meet current compliance requirements.  Melanie Bouillon, MD Michel Bickers B. Maximino Greenland MD, MD 07/11/2019 10:18:15 AM This report has been signed electronically. Number of Addenda: 0 Note Initiated On: 07/11/2019 9:57 AM Estimated Blood Loss:  Estimated blood loss: none.      [facility]

## 2019-07-11 NOTE — H&P (Signed)
[name], MD [address] [place], [place], [place], [address] [address], [place], [place], [place], [address] Phone: [phone]  Fax: [phone]  Primary Care Physician:  [facility]   Pre-Procedure History & Physical: HPI:  BRONDA ALFRED is a 49 y.o. female is here for a colonoscopy and EGD.   Past Medical History:  Diagnosis Date  . Abdominal wall ulcer ([place])   . Depression   . GERD (gastroesophageal reflux disease)   . HSV (herpes simplex virus) infection   . Migraine     Past Surgical History:  Procedure Laterality Date  . CHOLECYSTECTOMY    . GASTRIC BYPASS  2006  . tummy tuck      Prior to Admission medications   Medication Sig Start Date End Date Taking? Authorizing Provider  acetaminophen (TYLENOL) 650 MG CR tablet Take 650 mg by mouth every 8 (eight) hours as needed for pain.   Yes [provider]  acyclovir (ZOVIRAX) 200 MG capsule Take 2 capsules by mouth in the morning and at bedtime.    [provider]  ferrous sulfate (EQL SLOW RELEASE IRON) 160 (50 Fe) MG TBCR SR tablet Take 1 tablet (160 mg total) by mouth every other day. 06/01/19   Earleen Newport, MD  pantoprazole (PROTONIX) 40 MG tablet Take 1 tablet (40 mg total) by mouth 2 (two) times daily. 06/01/19   Earleen Newport, MD    Allergies as of 06/27/2019 - Review Complete 06/27/2019  Allergen Reaction Noted  . Penicillins Rash 10/03/2014    History reviewed. No pertinent family history.  Social History   Socioeconomic History  . Marital status: Unknown    Spouse name: Not on file  . Number of children: Not on file  . Years of education: Not on file  . Highest education level: Not on file  Occupational History  . Not on file  Tobacco Use  . Smoking status: Never Smoker  . Smokeless tobacco: Never Used  Substance and Sexual Activity  . Alcohol use: No  . Drug use: No  . Sexual activity: Not on file  Other Topics  Concern  . Not on file  Social History Narrative  . Not on file   Social Determinants of Health   Financial Resource Strain:   . Difficulty of Paying Living Expenses:   Food Insecurity:   . Worried About Charity fundraiser in the Last Year:   . Arboriculturist in the Last Year:   Transportation Needs:   . Film/video editor (Medical):   Marland Kitchen Lack of Transportation (Non-Medical):   Physical Activity:   . Days of Exercise per Week:   . Minutes of Exercise per Session:   Stress:   . Feeling of Stress :   Social Connections:   . Frequency of Communication with Friends and Family:   . Frequency of Social Gatherings with Friends and Family:   . Attends Religious Services:   . Active Member of Clubs or Organizations:   . Attends Archivist Meetings:   Marland Kitchen Marital Status:   Intimate Partner Violence:   . Fear of Current or Ex-Partner:   . Emotionally Abused:   Marland Kitchen Physically Abused:   . Sexually Abused:     Review of Systems: See HPI, otherwise negative ROS  Physical Exam: BP 108/89   Pulse 89   Temp 97.7 F (36.5 C) (Tympanic)   Resp 16   Ht 5\' 2"  (1.575 m)  Wt 82.6 kg   SpO2 100%   BMI 33.29 kg/m  General:   Alert,  pleasant and cooperative in NAD Head:  Normocephalic and atraumatic. Neck:  Supple; no masses or thyromegaly. Lungs:  Clear throughout to auscultation, normal respiratory effort.    Heart:  +S1, +S2, Regular rate and rhythm, No edema. Abdomen:  Soft, nontender and nondistended. Normal bowel sounds, without guarding, and without rebound.   Neurologic:  Alert and  oriented x4;  grossly normal neurologically.  Impression/Plan: JEANEANE ADAMEC is here for a colonoscopy to be performed for average risk screening and EGD for Abdominal pain  Risks, benefits, limitations, and alternatives regarding the procedures have been reviewed with the patient.  Questions have been answered.  All parties agreeable.   Pasty Spillers, MD  07/11/2019, 10:04  AM

## 2019-07-11 NOTE — Op Note (Signed)
[facility] Gastroenterology Patient Name: Melanie Olson Procedure Date: 07/11/2019 9:57 AM MRN: [id number] Account #: [id number] Date of Birth: 1970-11-07 Admit Type: Outpatient Age: 49 Room: [facility] ENDO ROOM 2 Gender: Female Note Status: Finalized Procedure:             Colonoscopy Indications:           Screening for colorectal malignant neoplasm Providers:             Fintan Grater B. Maximino Greenland MD, MD Medicines:             Monitored Anesthesia Care Complications:         No immediate complications. Procedure:             Pre-Anesthesia Assessment:                        - Prior to the procedure, a History and Physical was                         performed, and patient medications, allergies and                         sensitivities were reviewed. The patient's tolerance                         of previous anesthesia was reviewed.                        - The risks and benefits of the procedure and the                         sedation options and risks were discussed with the                         patient. All questions were answered and informed                         consent was obtained.                        - Patient identification and proposed procedure were                         verified prior to the procedure by the physician, the                         nurse, the anesthetist and the technician. The                         procedure was verified in the pre-procedure area in                         the procedure room in the endoscopy suite.                        - ASA Grade Assessment: II - A patient with mild                         systemic disease.                        -  After reviewing the risks and benefits, the patient                         was deemed in satisfactory condition to undergo the                         procedure.                        After obtaining informed consent, the colonoscope was                         passed under  direct vision. Throughout the procedure,                         the patient's blood pressure, pulse, and oxygen                         saturations were monitored continuously. The                         Colonoscope was introduced through the anus and                         advanced to the the cecum, identified by appendiceal                         orifice and ileocecal valve. The colonoscopy was                         performed with ease. The patient tolerated the                         procedure well. The quality of the bowel preparation                         was good. Findings:      The perianal and digital rectal examinations were normal.      The rectum, sigmoid colon, descending colon, transverse colon, ascending       colon and cecum appeared normal.      The retroflexed view of the distal rectum and anal verge was normal and       showed no anal or rectal abnormalities. Impression:            - The rectum, sigmoid colon, descending colon,                         transverse colon, ascending colon and cecum are normal.                        - The distal rectum and anal verge are normal on                         retroflexion view.                        - No specimens collected. Recommendation:        - Discharge patient to home.                        -  Resume previous diet.                        - Continue present medications.                        - Repeat colonoscopy in 10 years for screening                         purposes.                        - Return to primary care physician as previously                         scheduled.                        - The findings and recommendations were discussed with                         the patient.                        - The findings and recommendations were discussed with                         the patient's family. Procedure Code(s):     --- Professional ---                        [phone], Colonoscopy, flexible;  diagnostic, including                         collection of specimen(s) by brushing or washing, when                         performed (separate procedure) Diagnosis Code(s):     --- Professional ---                        Z12.11, Encounter for screening for malignant neoplasm                         of colon CPT copyright 2019 American Medical Association. All rights reserved. The codes documented in this report are preliminary and upon coder review may  be revised to meet current compliance requirements.  [name], MD Margretta Sidle B. Bonna Gains MD, MD 07/11/2019 10:49:50 AM This report has been signed electronically. Number of Addenda: 0 Note Initiated On: 07/11/2019 9:57 AM Scope Withdrawal Time: 0 hours 17 minutes 29 seconds  Total Procedure Duration: 0 hours 23 minutes 11 seconds       [facility]

## 2019-07-25 ENCOUNTER — Ambulatory Visit: Payer: Self-pay | Admitting: Gastroenterology

## 2019-09-24 ENCOUNTER — Other Ambulatory Visit: Payer: Self-pay

## 2019-09-25 ENCOUNTER — Other Ambulatory Visit: Payer: Self-pay

## 2019-09-25 DIAGNOSIS — Z20822 Contact with and (suspected) exposure to covid-19: Secondary | ICD-10-CM

## 2019-09-26 ENCOUNTER — Ambulatory Visit: Payer: Self-pay | Admitting: Gastroenterology

## 2019-09-26 LAB — NOVEL CORONAVIRUS, NAA: SARS-CoV-2, NAA: NOT DETECTED

## 2019-09-26 LAB — SARS-COV-2, NAA 2 DAY TAT

## 2019-10-15 ENCOUNTER — Ambulatory Visit: Payer: Self-pay | Admitting: Gastroenterology

## 2019-12-26 ENCOUNTER — Ambulatory Visit: Payer: Self-pay | Admitting: Gastroenterology

## 2020-02-05 ENCOUNTER — Ambulatory Visit: Payer: Self-pay | Admitting: Gastroenterology

## 2020-03-12 ENCOUNTER — Ambulatory Visit: Payer: Self-pay | Admitting: Gastroenterology

## 2020-03-21 ENCOUNTER — Emergency Department: Payer: Self-pay

## 2020-03-21 ENCOUNTER — Inpatient Hospital Stay
Admission: EM | Admit: 2020-03-21 | Discharge: 2020-03-24 | DRG: 378 | Disposition: A | Discharge status: Home / Self Care | Payer: Self-pay | Attending: Internal Medicine | Admitting: Internal Medicine

## 2020-03-21 ENCOUNTER — Other Ambulatory Visit: Payer: Self-pay

## 2020-03-21 DIAGNOSIS — K7460 Unspecified cirrhosis of liver: Secondary | ICD-10-CM

## 2020-03-21 DIAGNOSIS — K289 Gastrojejunal ulcer, unspecified as acute or chronic, without hemorrhage or perforation: Secondary | ICD-10-CM

## 2020-03-21 DIAGNOSIS — K7581 Nonalcoholic steatohepatitis (NASH): Secondary | ICD-10-CM | POA: Diagnosis present

## 2020-03-21 DIAGNOSIS — D62 Acute posthemorrhagic anemia: Secondary | ICD-10-CM | POA: Diagnosis present

## 2020-03-21 DIAGNOSIS — F419 Anxiety disorder, unspecified: Secondary | ICD-10-CM | POA: Diagnosis present

## 2020-03-21 DIAGNOSIS — K5900 Constipation, unspecified: Secondary | ICD-10-CM

## 2020-03-21 DIAGNOSIS — K279 Peptic ulcer, site unspecified, unspecified as acute or chronic, without hemorrhage or perforation: Secondary | ICD-10-CM

## 2020-03-21 DIAGNOSIS — Z79899 Other long term (current) drug therapy: Secondary | ICD-10-CM

## 2020-03-21 DIAGNOSIS — D509 Iron deficiency anemia, unspecified: Secondary | ICD-10-CM | POA: Diagnosis present

## 2020-03-21 DIAGNOSIS — Z9884 Bariatric surgery status: Secondary | ICD-10-CM

## 2020-03-21 DIAGNOSIS — K922 Gastrointestinal hemorrhage, unspecified: Principal | ICD-10-CM

## 2020-03-21 DIAGNOSIS — Z20822 Contact with and (suspected) exposure to covid-19: Secondary | ICD-10-CM | POA: Diagnosis present

## 2020-03-21 DIAGNOSIS — E876 Hypokalemia: Secondary | ICD-10-CM | POA: Diagnosis present

## 2020-03-21 DIAGNOSIS — T85898A Other specified complication of other internal prosthetic devices, implants and grafts, initial encounter: Secondary | ICD-10-CM

## 2020-03-21 DIAGNOSIS — D649 Anemia, unspecified: Secondary | ICD-10-CM | POA: Diagnosis present

## 2020-03-21 DIAGNOSIS — K254 Chronic or unspecified gastric ulcer with hemorrhage: Principal | ICD-10-CM | POA: Diagnosis present

## 2020-03-21 DIAGNOSIS — F341 Dysthymic disorder: Secondary | ICD-10-CM

## 2020-03-21 DIAGNOSIS — Z880 Allergy status to penicillin: Secondary | ICD-10-CM

## 2020-03-21 LAB — COMPREHENSIVE METABOLIC PANEL
ALT: 20 U/L (ref 0–44)
AST: 20 U/L (ref 15–41)
Albumin: 3.7 g/dL (ref 3.5–5.0)
Alkaline Phosphatase: 91 U/L (ref 38–126)
Anion gap: 11 (ref 5–15)
BUN: 9 mg/dL (ref 6–20)
CO2: 22 mmol/L (ref 22–32)
Calcium: 8.6 mg/dL — ABNORMAL LOW (ref 8.9–10.3)
Chloride: 102 mmol/L (ref 98–111)
Creatinine, Ser: 0.53 mg/dL (ref 0.44–1.00)
GFR, Estimated: >60 mL/min (ref >60)
Glucose, Bld: 98 mg/dL (ref 70–99)
Potassium: 3.4 mmol/L — ABNORMAL LOW (ref 3.5–5.1)
Sodium: 135 mmol/L (ref 135–145)
Total Bilirubin: 1 mg/dL (ref 0.3–1.2)
Total Protein: 6.9 g/dL (ref 6.5–8.1)

## 2020-03-21 LAB — CBC
HCT: 24.7 % — ABNORMAL LOW (ref 36.0–46.0)
HCT: 26.6 % — ABNORMAL LOW (ref 36.0–46.0)
Hemoglobin: 6.7 g/dL — ABNORMAL LOW (ref 12.0–15.0)
Hemoglobin: 7.4 g/dL — ABNORMAL LOW (ref 12.0–15.0)
MCH: 17.6 pg — ABNORMAL LOW (ref 26.0–34.0)
MCH: 19.2 pg — ABNORMAL LOW (ref 26.0–34.0)
MCHC: 27.1 g/dL — ABNORMAL LOW (ref 30.0–36.0)
MCHC: 27.8 g/dL — ABNORMAL LOW (ref 30.0–36.0)
MCV: 64.8 fL — ABNORMAL LOW (ref 80.0–100.0)
MCV: 68.9 fL — ABNORMAL LOW (ref 80.0–100.0)
Platelets: 377 10*3/uL (ref 150–400)
Platelets: 311 10*3/uL (ref 150–400)
RBC: 3.81 MIL/uL — ABNORMAL LOW (ref 3.87–5.11)
RBC: 3.86 MIL/uL — ABNORMAL LOW (ref 3.87–5.11)
RDW: 20 % — ABNORMAL HIGH (ref 11.5–15.5)
RDW: 22.8 % — ABNORMAL HIGH (ref 11.5–15.5)
WBC: 5.1 10*3/uL (ref 4.0–10.5)
WBC: 6.7 10*3/uL (ref 4.0–10.5)
nRBC: 0 % (ref 0.0–0.2)
nRBC: 0 % (ref 0.0–0.2)

## 2020-03-21 LAB — URINALYSIS, COMPLETE (UACMP) WITH MICROSCOPIC
Bilirubin Urine: NEGATIVE
Glucose, UA: NEGATIVE mg/dL
Hgb urine dipstick: NEGATIVE
Ketones, ur: 80 mg/dL — AB
Leukocytes,Ua: NEGATIVE
Nitrite: NEGATIVE
Protein, ur: NEGATIVE mg/dL
Specific Gravity, Urine: 1.013 (ref 1.005–1.030)
pH: 5 (ref 5.0–8.0)

## 2020-03-21 LAB — ABO/RH: ABO/RH(D): A POS

## 2020-03-21 LAB — PREPARE RBC (CROSSMATCH)

## 2020-03-21 LAB — LIPASE, BLOOD: Lipase: 25 U/L (ref 11–51)

## 2020-03-21 LAB — HCG, QUANTITATIVE, PREGNANCY: hCG, Beta Chain, Quant, S: 1 m[IU]/mL (ref <5)

## 2020-03-21 LAB — ACETAMINOPHEN LEVEL: Acetaminophen (Tylenol), Serum: <10 ug/mL — ABNORMAL LOW (ref 10–30)

## 2020-03-21 MED ORDER — METOPROLOL TARTRATE 5 MG/5ML IV SOLN: 5.0000 mg | INTRAVENOUS | Status: DC | PRN

## 2020-03-21 MED ORDER — AMITRIPTYLINE HCL 25 MG PO TABS
25.0000 mg | ORAL_TABLET | Freq: Every day | ORAL | Status: DC
  Administered 2020-03-21 – 2020-03-23 (×3): 25 mg via ORAL
  Filled 2020-03-21 (×4): qty 1

## 2020-03-21 MED ORDER — LACTATED RINGERS IV BOLUS
1000.0000 mL | Freq: Once | INTRAVENOUS | Status: AC
  Administered 2020-03-21: 1000 mL via INTRAVENOUS

## 2020-03-21 MED ORDER — ONDANSETRON HCL 4 MG/2ML IJ SOLN: 4.0000 mg | Freq: Four times a day (QID) | INTRAMUSCULAR | Status: DC | PRN

## 2020-03-21 MED ORDER — PANTOPRAZOLE SODIUM 40 MG IV SOLR
40.0000 mg | Freq: Once | INTRAVENOUS | Status: AC
  Administered 2020-03-21: 40 mg via INTRAVENOUS
  Filled 2020-03-21: qty 40

## 2020-03-21 MED ORDER — MORPHINE SULFATE (PF) 4 MG/ML IV SOLN
4.0000 mg | Freq: Once | INTRAVENOUS | Status: AC
  Administered 2020-03-21: 4 mg via INTRAVENOUS
  Filled 2020-03-21: qty 1

## 2020-03-21 MED ORDER — ONDANSETRON HCL 4 MG/2ML IJ SOLN
4.0000 mg | Freq: Once | INTRAMUSCULAR | Status: AC
  Administered 2020-03-21: 4 mg via INTRAVENOUS
  Filled 2020-03-21: qty 2

## 2020-03-21 MED ORDER — ACETAMINOPHEN 325 MG PO TABS
325.0000 mg | ORAL_TABLET | Freq: Four times a day (QID) | ORAL | Status: AC | PRN
  Administered 2020-03-22: 325 mg via ORAL
  Filled 2020-03-21: qty 1

## 2020-03-21 MED ORDER — ACETAMINOPHEN 650 MG RE SUPP: 325.0000 mg | Freq: Four times a day (QID) | RECTAL | Status: AC | PRN

## 2020-03-21 MED ORDER — ACETAMINOPHEN 650 MG RE SUPP: 325.0000 mg | Freq: Four times a day (QID) | RECTAL | Status: DC | PRN

## 2020-03-21 MED ORDER — SODIUM CHLORIDE 0.9 % IV SOLN
8.0000 mg/h | INTRAVENOUS | Status: DC
  Administered 2020-03-21 – 2020-03-23 (×2): 8 mg/h via INTRAVENOUS
  Filled 2020-03-21 (×4): qty 80

## 2020-03-21 MED ORDER — SODIUM CHLORIDE 0.9 % IV SOLN: INTRAVENOUS | Status: AC

## 2020-03-21 MED ORDER — HYDRALAZINE HCL 25 MG PO TABS: 25.0000 mg | ORAL_TABLET | Freq: Three times a day (TID) | ORAL | Status: DC | PRN

## 2020-03-21 MED ORDER — SODIUM CHLORIDE 0.9 % IV SOLN
10.0000 mL/h | Freq: Once | INTRAVENOUS | Status: AC
  Administered 2020-03-24: 10 mL/h via INTRAVENOUS

## 2020-03-21 MED ORDER — ONDANSETRON HCL 4 MG PO TABS: 4.0000 mg | ORAL_TABLET | Freq: Four times a day (QID) | ORAL | Status: DC | PRN

## 2020-03-21 MED ORDER — IOHEXOL 300 MG/ML  SOLN
100.0000 mL | Freq: Once | INTRAMUSCULAR | Status: AC | PRN
  Administered 2020-03-21: 100 mL via INTRAVENOUS

## 2020-03-21 MED ORDER — IOHEXOL 9 MG/ML PO SOLN
500.0000 mL | Freq: Two times a day (BID) | ORAL | Status: DC | PRN
  Administered 2020-03-21: 500 mL via ORAL
  Filled 2020-03-21: qty 500

## 2020-03-21 MED ORDER — ACETAMINOPHEN 325 MG PO TABS: 325.0000 mg | ORAL_TABLET | Freq: Four times a day (QID) | ORAL | Status: DC | PRN

## 2020-03-21 MED ORDER — MORPHINE SULFATE (PF) 2 MG/ML IV SOLN
2.0000 mg | INTRAVENOUS | Status: AC | PRN
Start: 2020-03-21 — End: 2020-03-22

## 2020-03-21 NOTE — ED Notes (Signed)
Patient tolerating blood transfusion.

## 2020-03-21 NOTE — ED Notes (Signed)
nicole in lab notified of need for venipuncture assistance with h.pylori, cbc and hiv labs.

## 2020-03-21 NOTE — ED Triage Notes (Addendum)
Pt to ED POV for chief complaint of constipation since feb 4th. States able to pass gas.   Also c/o generalized lower abdominal pain

## 2020-03-21 NOTE — ED Notes (Signed)
Lab here for venipuncture.  

## 2020-03-21 NOTE — ED Notes (Addendum)
IV infiltrated. Was removed by this RN. Order for ultrasound entered.

## 2020-03-21 NOTE — ED Provider Notes (Signed)
-----------------------------------------   5:34 PM on 03/21/2020 -----------------------------------------  Patient CT shows no significant findings besides constipation.  Rectal examination shows no hard stool in the rectal vault but it is mildly guaiac positive.  Given the patient's low hemoglobin and guaiac positive stool highly suspect upper GI bleed which the patient has had previously as well.  Given hemoglobin of 6.7 blood products are ordered for the patient.  I have added a Protonix infusion we will talk to the hospitalist for admission.  Patient's abdomen is nontender on my exam.   Minna Antis, MD 03/21/20 1735

## 2020-03-21 NOTE — H&P (Signed)
History and Physical   Melanie Olson BTD:[id number] DOB: [birth year] DOA: 03/21/2020  PCP: [facility]  Outpatient Specialists: Dr. Maximino Greenland, gastroenterology Patient coming from: Home  I have personally briefly reviewed patient's old medical records in [facility] EMR.  Chief Concern: Abdominal pain  HPI: Melanie Olson is a 50 y.o. female with medical history significant for history of obesity s/p roux-en0 y in 2005, gastric ulcer, GERD on PPI , chronic constipation, abdominoplasty in 2011, cholecystectomy, upper endoscopy in 2014 presents to the ED for chief concern of abdominal pain.   She reports the abdominal pain is periumbilical and describes it as excruciating, 1010. She endorses that since 03/12/20, she has not had a BM.  She states that she took stool softeners and gave herself two enemas.  These measures did not result in a bowel movement.  She endorses that she is passing gas.  She endorses nausea and denies vomiting.  She reports that she has never had pain like this before.  She reports that the ED medications have helped relieved her pain significantly.  Of note, normal colonoscopy and endoscopy in 07/11/2019 with Dr. Tawni Carnes as report is scanned into media.  Social history: lives with son and mother. Formerly, social smoker and social drinker at dinner 1 -2 every 1-2 months.  She denies all recreational drug use. 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   Allergies: no life threatening   ROS: Constitutional: no weight change, no fever ENT/Mouth: no sore throat, no rhinorrhea Eyes: no eye pain, no vision changes Cardiovascular: no chest pain, no dyspnea,  no edema, no palpitations Respiratory: no cough, no sputum, no wheezing Gastrointestinal: + nausea, no vomiting, no diarrhea, + constipation Genitourinary: no urinary incontinence, no dysuria, no hematuria Musculoskeletal: no arthralgias, no myalgias Skin:  no skin lesions, no pruritus, Neuro: + weakness, no loss of consciousness, no syncope Psych: no anxiety, no depression, + decrease appetite Heme/Lymph: no bruising, no bleeding  ED Course: Discussed with ED provider, patient requiring hospitalization due to acute abdominal pain and found to have hemoglobin of 6.7.  ED provider ordered 1 unit of packed red blood cells.  ED vitals revealed: Temperature 99 orally, respiration rate of 12-19, heart rate in the 70s, and normal sinus rhythm, blood pressure 113/74.  Assessment/Plan  Active Problems:   Severe anemia   Anemia-suspect secondary to upper GI bleeding -Status post 1 unit packed red blood cells with appropriate increase -GI consult, Dr. Servando Snare via secure chat -N.p.o. after midnight  Abdominal pain-possible gastric ulcer -Pain management: Morphine 2 mg IV every 2 hours.  For severe pain  Depression/anxiety-amitriptyline 25 mg p.o. 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   As needed meds: Hydralazine 25 mg p.o. every 8 hours.  For SBP greater than 180, acetaminophen, morphine, ondansetron  DVT prophylaxis: TED hose Code Status: full code  Diet: Heart healthy diet now n.p.o. after midnight Family Communication: No Disposition Plan: Pending clinical course Consults called: Gastroenterology Admission status: Observation to progressive cardiac  Past Medical History:  Diagnosis Date  . Abdominal wall ulcer (HCC)   . Depression   . GERD (gastroesophageal reflux disease)   . HSV (herpes simplex virus) infection   . Migraine    Past Surgical History:  Procedure Laterality Date  . CHOLECYSTECTOMY    . COLONOSCOPY WITH PROPOFOL  N/A 07/11/2019   Procedure: COLONOSCOPY WITH PROPOFOL;  Surgeon: Pasty Spillers, MD;  Location: [facility] ENDOSCOPY;  Service: Endoscopy;  Laterality: N/A;   . ESOPHAGOGASTRODUODENOSCOPY (EGD) WITH PROPOFOL N/A 07/11/2019   Procedure: ESOPHAGOGASTRODUODENOSCOPY (EGD) WITH PROPOFOL;  Surgeon: Pasty Spillers, MD;  Location: [facility] ENDOSCOPY;  Service: Endoscopy;  Laterality: N/A;  . GASTRIC BYPASS  2006  . tummy tuck     Social History:  reports that she has never smoked. She has never used smokeless tobacco. She reports that she does not drink alcohol and does not use drugs.  Allergies  Allergen Reactions  . Penicillins Rash   Family history: Family history reviewed and not pertinent  Prior to Admission medications   Medication Sig Start Date End Date Taking? Authorizing Provider  acetaminophen (TYLENOL) 650 MG CR tablet Take 650 mg by mouth every 8 (eight) hours as needed for pain.    [provider]  acyclovir (ZOVIRAX) 200 MG capsule Take 2 capsules by mouth in the morning and at bedtime.    [provider]  ferrous sulfate (EQL SLOW RELEASE IRON) 160 (50 Fe) MG TBCR SR tablet Take 1 tablet (160 mg total) by mouth every other day. 06/01/19   Emily Filbert, MD  gabapentin (NEURONTIN) 300 MG capsule Take 1 capsule by mouth 2 (two) times daily. 06/30/18   [provider]  pantoprazole (PROTONIX) 40 MG tablet Take 1 tablet (40 mg total) by mouth 2 (two) times daily. 06/01/19   Emily Filbert, MD   Physical Exam: Vitals:   03/21/20 1930 03/21/20 1932 03/21/20 2040 03/21/20 2045  BP: (!) 80/67 100/64 (!) 109/59   Pulse: 82 77  80  Resp: 15 14    Temp:      TempSrc:      SpO2: 100% 100%  98%  Weight:      Height:       Constitutional: appears age-appropriate, NAD, calm Eyes: PERRL, lids and conjunctivae normal ENMT: Mucous membranes are moist. Posterior pharynx clear of any exudate or lesions. Age-appropriate dentition. Hearing appropriate Neck: normal, supple, no masses, no thyromegaly Respiratory: clear to auscultation bilaterally, no wheezing, no crackles. Normal respiratory effort. No accessory  muscle use.  Cardiovascular: Regular rate and rhythm, no murmurs / rubs / gallops. No extremity edema. 2+ pedal pulses. No carotid bruits.  Abdomen: no tenderness, no masses palpated, no hepatosplenomegaly. Bowel sounds positive.  Musculoskeletal: no clubbing / cyanosis. No joint deformity upper and lower extremities. Good ROM, no contractures, no atrophy. Normal muscle tone.  Skin: no rashes, lesions, ulcers. No induration Neurologic: Sensation intact. Strength 5/5 in all 4.  Psychiatric: Normal judgment and insight. Alert and oriented x 3. Normal mood.   EKG: Not indicated  Chest x-ray on Admission: I personally reviewed and I agree with radiologist reading as below.  CT Abdomen Pelvis W Contrast  Result Date: 03/21/2020 CLINICAL DATA:  Abdominal pain EXAM: CT ABDOMEN AND PELVIS WITH CONTRAST TECHNIQUE: Multidetector CT imaging of the abdomen and pelvis was performed using the standard protocol following bolus administration of intravenous contrast. Oral contrast was also administered. CONTRAST:  OMNIPAQUE IOHEXOL 300 MG/ML  SOLN COMPARISON:  June 01, 2019 FINDINGS: Lower chest: Lung bases are clear. There is a stable right epicardial cyst measuring 2.6 x 1.3 cm. Hepatobiliary: There is hepatic steatosis. Beyond hepatic steatosis, no focal liver lesions are appreciable. Gallbladder is absent. The common hepatic duct remains prominent at 10 mm, stable. No biliary duct mass or calculus evident by  CT. Pancreas: No pancreatic mass or inflammatory focus evident. Spleen: No splenic lesions are evident. Adrenals/Urinary Tract: Adrenals bilaterally appear normal. There is a cyst measuring 8 mm in the lateral mid right kidney. There are parapelvic cysts on the right, largest measuring 2.5 x 1.5 cm. There is a 1 x 1 cm parapelvic cyst on the left. There is a cyst medial to the renal pelvis in the mid left kidney measuring 7 x 7 mm. There is an extrarenal pelvis on each side, an anatomic variant. There  is no hydronephrosis on either side. There is no evident renal or ureteral calculus on either side. Urinary bladder is midline with wall thickness within normal limits. Stomach/Bowel: Patient is status post gastric bypass procedure. No wall thickening or fluid in perioperative regions noted. There is diffuse stool throughout much of the colon. There is no appreciable diverticular disease. No appreciable bowel wall or mesenteric thickening evident. There is no appreciable bowel obstruction. The terminal ileum appears normal. There is no appreciable free air or portal venous air. Vascular/Lymphatic: There is no abdominal aortic aneurysm. No arterial vascular lesions are evident. Major venous structures appear patent. There is no evident adenopathy in the abdomen or pelvis. Scattered subcentimeter mesenteric lymph nodes are considered nonspecific. Reproductive: Uterus is anteverted. There is a decreased attenuation mass in the volar aspect of the uterus measuring 3.6 x 3.6 cm, an apparent leiomyoma. This apparent leiomyoma displaces the endometrium slightly dorsally. No adnexal masses identified beyond physiologic follicles. Intrauterine device no longer present. Other: There is no periappendiceal region inflammatory change. No abscess or ascites is evident in the abdomen or pelvis. Musculoskeletal: No blastic or lytic bone lesions. No abdominal wall or intramuscular lesions are evident. IMPRESSION: 1. Fairly diffuse stool throughout much of the colon. No bowel wall thickening or bowel obstruction. Status post gastric bypass procedure without complicating features evident. 2. Hepatic steatosis. Gallbladder absent. Borderline common hepatic and proximal common bile duct dilatation without mass or calculus evident by CT. 3. Leiomyoma within the mid uterus which causes mild impression upon the endometrium. Intrauterine device no longer present. 4. No renal or ureteral calculus. No hydronephrosis. Urinary bladder wall  thickness normal. 5.  Small stable epicardial cyst on the right. Electronically Signed   By: Bretta BangWilliam  Woodruff III M.D.   On: 03/21/2020 16:18   Labs on Admission: I have personally reviewed following labs  CBC: Recent Labs  Lab 03/21/20 1130 03/21/20 2119  WBC 5.1 6.7  HGB 6.7* 7.4*  HCT 24.7* 26.6*  MCV 64.8* 68.9*  PLT 377 311   Basic Metabolic Panel: Recent Labs  Lab 03/21/20 1130  NA 135  K 3.4*  CL 102  CO2 22  GLUCOSE 98  BUN 9  CREATININE 0.53  CALCIUM 8.6*   GFR: Estimated Creatinine Clearance: 82.6 mL/min (by C-G formula based on SCr of 0.53 mg/dL).  Liver Function Tests: Recent Labs  Lab 03/21/20 1130  AST 20  ALT 20  ALKPHOS 91  BILITOT 1.0  PROT 6.9  ALBUMIN 3.7   Recent Labs  Lab 03/21/20 1130  LIPASE 25   Urine analysis:    Component Value Date/Time   COLORURINE YELLOW (A) 03/21/2020 1453   APPEARANCEUR HAZY (A) 03/21/2020 1453   APPEARANCEUR Hazy 08/23/2012 0628   LABSPEC 1.013 03/21/2020 1453   LABSPEC 1.026 08/23/2012 0628   PHURINE 5.0 03/21/2020 1453   GLUCOSEU NEGATIVE 03/21/2020 1453   GLUCOSEU Negative 08/23/2012 0628   HGBUR NEGATIVE 03/21/2020 1453   BILIRUBINUR NEGATIVE 03/21/2020  1453   BILIRUBINUR Negative 08/23/2012 0628   KETONESUR 80 (A) 03/21/2020 1453   PROTEINUR NEGATIVE 03/21/2020 1453   NITRITE NEGATIVE 03/21/2020 1453   LEUKOCYTESUR NEGATIVE 03/21/2020 1453   LEUKOCYTESUR 1+ 08/23/2012 0628   Faheem Ziemann N Fabrizio Filip D.O. [facility]  If 7PM-7AM, please contact overnight-coverage provider If 7AM-7PM, please contact day coverage provider [email or web address]  03/21/2020, 10:09 PM

## 2020-03-21 NOTE — ED Notes (Signed)
Pt transferred to C pod. Ruben Reason to assume care of patient at this time.

## 2020-03-21 NOTE — ED Provider Notes (Signed)
Asthma  [facility] Emergency Department Provider Note   ____________________________________________   Event Date/Time   First MD Initiated Contact with Patient 03/21/20 1306     (approximate)  I have reviewed the triage vital signs and the nursing notes.   HISTORY  Chief Complaint Constipation    HPI Melanie Olson is a 50 y.o. female with past medical history of peptic ulcer disease, GERD, and gastric bypass who presents to the ED complaining of constipation.  Patient reports that she has not had a bowel movement in the past 8 days, has subsequently developed increasing upper abdominal pain.  She has been feeling nauseous but has not vomited, denies any dysuria or hematuria.  She has had similar issues with constipation and pain before, has been diagnosed with peptic ulcer disease and also required admission for potential GI bleed in the past.  She does not take any blood thinners, but notes she has been feeling very weak and fatigued lately.  She has not been taking any NSAIDs and denies alcohol abuse, does states she has been taking at least 3 Tylenol daily for multiple weeks.  She denies any fevers, cough, chest pain, or shortness of breath.        Past Medical History:  Diagnosis Date  . Abdominal wall ulcer (HCC)   . Depression   . GERD (gastroesophageal reflux disease)   . HSV (herpes simplex virus) infection   . Migraine     There are no problems to display for this patient.   Past Surgical History:  Procedure Laterality Date  . CHOLECYSTECTOMY    . COLONOSCOPY WITH PROPOFOL N/A 07/11/2019   Procedure: COLONOSCOPY WITH PROPOFOL;  Surgeon: Pasty Spillers, MD;  Location: [facility] ENDOSCOPY;  Service: Endoscopy;  Laterality: N/A;  . ESOPHAGOGASTRODUODENOSCOPY (EGD) WITH PROPOFOL N/A 07/11/2019   Procedure: ESOPHAGOGASTRODUODENOSCOPY (EGD) WITH PROPOFOL;  Surgeon: Pasty Spillers, MD;  Location: [facility] ENDOSCOPY;  Service: Endoscopy;   Laterality: N/A;  . GASTRIC BYPASS  2006  . tummy tuck      Prior to Admission medications   Medication Sig Start Date End Date Taking? Authorizing Provider  acetaminophen (TYLENOL) 650 MG CR tablet Take 650 mg by mouth every 8 (eight) hours as needed for pain.    [provider]  acyclovir (ZOVIRAX) 200 MG capsule Take 2 capsules by mouth in the morning and at bedtime.    [provider]  ferrous sulfate (EQL SLOW RELEASE IRON) 160 (50 Fe) MG TBCR SR tablet Take 1 tablet (160 mg total) by mouth every other day. 06/01/19   Emily Filbert, MD  gabapentin (NEURONTIN) 300 MG capsule Take 1 capsule by mouth 2 (two) times daily. 06/30/18   [provider]  pantoprazole (PROTONIX) 40 MG tablet Take 1 tablet (40 mg total) by mouth 2 (two) times daily. 06/01/19   Emily Filbert, MD    Allergies Penicillins  No family history on file.  Social History Social History   Tobacco Use  . Smoking status: Never Smoker  . Smokeless tobacco: Never Used  Vaping Use  . Vaping Use: Never used  Substance Use Topics  . Alcohol use: No  . Drug use: No    Review of Systems  Constitutional: No fever/chills.  Positive for generalized weakness and fatigue. Eyes: No visual changes. ENT: No sore throat. Cardiovascular: Denies chest pain. Respiratory: Denies shortness of breath. Gastrointestinal: Positive for abdominal pain and nausea, no vomiting.  No diarrhea.  No constipation. Genitourinary: Negative  for dysuria. Musculoskeletal: Negative for back pain. Skin: Negative for rash. Neurological: Negative for headaches, focal weakness or numbness.  ____________________________________________   PHYSICAL EXAM:  VITAL SIGNS: ED Triage Vitals  Enc Vitals Group     BP 03/21/20 1123 123/74     Pulse Rate 03/21/20 1123 100     Resp 03/21/20 1123 18     Temp 03/21/20 1123 98.6 F (37 C)     Temp Source 03/21/20 1123 Oral     SpO2 03/21/20 1123 100 %     Weight  03/21/20 1124 173 lb (78.5 kg)     Height 03/21/20 1124 5\' 2"  (1.575 m)     Olson Circumference --      Peak Flow --      Pain Score 03/21/20 1124 10     Pain Loc --      Pain Edu? --      Excl. in GC? --     Constitutional: Alert and oriented. Eyes: Conjunctivae are normal. Olson: Atraumatic. Nose: No congestion/rhinnorhea. Mouth/Throat: Mucous membranes are moist. Neck: Normal ROM Cardiovascular: Normal rate, regular rhythm. Grossly normal heart sounds. Respiratory: Normal respiratory effort.  No retractions. Lungs CTAB. Gastrointestinal: Soft and tender to palpation in the epigastrium with no rebound or guarding. No distention. Genitourinary: deferred Musculoskeletal: No lower extremity tenderness nor edema. Neurologic:  Normal speech and language. No gross focal neurologic deficits are appreciated. Skin:  Skin is warm, dry and intact. No rash noted. Psychiatric: Mood and affect are normal. Speech and behavior are normal.  ____________________________________________   LABS (all labs ordered are listed, but only abnormal results are displayed)  Labs Reviewed  COMPREHENSIVE METABOLIC PANEL - Abnormal; Notable for the following components:      Result Value   Potassium 3.4 (*)    Calcium 8.6 (*)    All other components within normal limits  CBC - Abnormal; Notable for the following components:   RBC 3.81 (*)    Hemoglobin 6.7 (*)    HCT 24.7 (*)    MCV 64.8 (*)    MCH 17.6 (*)    MCHC 27.1 (*)    RDW 20.0 (*)    All other components within normal limits  URINALYSIS, COMPLETE (UACMP) WITH MICROSCOPIC - Abnormal; Notable for the following components:   Color, Urine YELLOW (*)    APPearance HAZY (*)    Ketones, ur 80 (*)    Bacteria, UA MANY (*)    All other components within normal limits  SARS CORONAVIRUS 2 (TAT 6-24 HRS)  LIPASE, BLOOD  HCG, QUANTITATIVE, PREGNANCY  ACETAMINOPHEN LEVEL  POC URINE PREG, ED  TYPE AND SCREEN  PREPARE RBC (CROSSMATCH)  ABO/RH     PROCEDURES  Procedure(s) performed (including Critical Care):  .Critical Care Performed by: 05/19/20, MD Authorized by: Chesley Noon, MD   Critical care provider statement:    Critical care time (minutes):  45   Critical care time was exclusive of:  Separately billable procedures and treating other patients and teaching time   Critical care was necessary to treat or prevent imminent or life-threatening deterioration of the following conditions:  Circulatory failure   Critical care was time spent personally by me on the following activities:  Discussions with consultants, evaluation of patient's response to treatment, examination of patient, ordering and performing treatments and interventions, ordering and review of laboratory studies, ordering and review of radiographic studies, pulse oximetry, re-evaluation of patient's condition, obtaining history from patient or surrogate and review of old  charts   I assumed direction of critical care for this patient from another provider in my specialty: no       ____________________________________________   INITIAL IMPRESSION / ASSESSMENT AND PLAN / ED COURSE       50 year old female with past medical history of peptic ulcer disease, GERD, gastric bypass who presents to the ED with 8 days of constipation now associated with worsening epigastric pain and nausea.  Patient has not noticed any melena or dark tarry stools, however lab work completed from triage is significant for hemoglobin of 6.7.  She is hemodynamically stable and I suspect she has been bleeding slowly from recurrent peptic ulcer disease given her epigastric pain.  We will treat with IV Protonix as well as morphine and Zofran, hydrate with IV fluids.  Will obtain CT scan to ensure no bowel obstruction, or other complication related to her prior gastric bypass.  We will transfuse 1 unit PRBCs and anticipate admission to hospitalist service.  Patient turned over to  oncoming provider pending results of CT scan and admission.      ____________________________________________   FINAL CLINICAL IMPRESSION(S) / ED DIAGNOSES  Final diagnoses:  Gastrointestinal hemorrhage, unspecified gastrointestinal hemorrhage type  PUD (peptic ulcer disease)  Anemia, unspecified type     ED Discharge Orders    None       Note:  This document was prepared using Dragon voice recognition software and may include unintentional dictation errors.   Chesley Noon, MD 03/21/20 [phone]

## 2020-03-22 ENCOUNTER — Other Ambulatory Visit: Payer: Self-pay

## 2020-03-22 ENCOUNTER — Encounter: Payer: Self-pay | Admitting: Internal Medicine

## 2020-03-22 DIAGNOSIS — D649 Anemia, unspecified: Secondary | ICD-10-CM | POA: Diagnosis present

## 2020-03-22 DIAGNOSIS — K5909 Other constipation: Secondary | ICD-10-CM

## 2020-03-22 DIAGNOSIS — K5900 Constipation, unspecified: Secondary | ICD-10-CM

## 2020-03-22 LAB — IRON AND TIBC
Iron: 22 ug/dL — ABNORMAL LOW (ref 28–170)
Saturation Ratios: 5 % — ABNORMAL LOW (ref 10.4–31.8)
TIBC: 409 ug/dL (ref 250–450)
UIBC: 387 ug/dL

## 2020-03-22 LAB — BASIC METABOLIC PANEL
Anion gap: 7 (ref 5–15)
BUN: 8 mg/dL (ref 6–20)
CO2: 24 mmol/L (ref 22–32)
Calcium: 7.9 mg/dL — ABNORMAL LOW (ref 8.9–10.3)
Chloride: 106 mmol/L (ref 98–111)
Creatinine, Ser: 0.47 mg/dL (ref 0.44–1.00)
GFR, Estimated: >60 mL/min (ref >60)
Glucose, Bld: 91 mg/dL (ref 70–99)
Potassium: 3.1 mmol/L — ABNORMAL LOW (ref 3.5–5.1)
Sodium: 137 mmol/L (ref 135–145)

## 2020-03-22 LAB — CBC
HCT: 24.6 % — ABNORMAL LOW (ref 36.0–46.0)
Hemoglobin: 6.9 g/dL — ABNORMAL LOW (ref 12.0–15.0)
MCH: 19.3 pg — ABNORMAL LOW (ref 26.0–34.0)
MCHC: 28 g/dL — ABNORMAL LOW (ref 30.0–36.0)
MCV: 68.7 fL — ABNORMAL LOW (ref 80.0–100.0)
Platelets: 299 10*3/uL (ref 150–400)
RBC: 3.58 MIL/uL — ABNORMAL LOW (ref 3.87–5.11)
RDW: 22.6 % — ABNORMAL HIGH (ref 11.5–15.5)
WBC: 5.8 10*3/uL (ref 4.0–10.5)
nRBC: 0 % (ref 0.0–0.2)

## 2020-03-22 LAB — PREPARE RBC (CROSSMATCH)

## 2020-03-22 LAB — VITAMIN B12: Vitamin B-12: 871 pg/mL (ref 180–914)

## 2020-03-22 LAB — TSH: TSH: 2.473 u[IU]/mL (ref 0.350–4.500)

## 2020-03-22 LAB — SARS CORONAVIRUS 2 (TAT 6-24 HRS): SARS Coronavirus 2: NEGATIVE

## 2020-03-22 LAB — HEMOGLOBIN
Hemoglobin: 8.2 g/dL — ABNORMAL LOW (ref 12.0–15.0)
Hemoglobin: 8 g/dL — ABNORMAL LOW (ref 12.0–15.0)

## 2020-03-22 LAB — HIV ANTIBODY (ROUTINE TESTING W REFLEX): HIV Screen 4th Generation wRfx: NONREACTIVE

## 2020-03-22 MED ORDER — SODIUM CHLORIDE 0.9% IV SOLUTION
Freq: Once | INTRAVENOUS | Status: AC
  Filled 2020-03-22: qty 250

## 2020-03-22 MED ORDER — SODIUM CHLORIDE 0.9% FLUSH: 10.0000 mL | INTRAVENOUS | Status: DC | PRN

## 2020-03-22 MED ORDER — POTASSIUM CHLORIDE 10 MEQ/100ML IV SOLN: 10.0000 meq | INTRAVENOUS | Status: AC

## 2020-03-22 MED ORDER — POLYETHYLENE GLYCOL 3350 17 G PO PACK
17.0000 g | PACK | Freq: Two times a day (BID) | ORAL | Status: DC
  Administered 2020-03-22 – 2020-03-24 (×4): 17 g via ORAL
  Filled 2020-03-22 (×4): qty 1

## 2020-03-22 NOTE — Progress Notes (Signed)
PROGRESS NOTE    Melanie Olson  ZDG:[id number] DOB: 22-Jan-1971 DOA: 03/21/2020 PCP: [facility]   Chief Complaint.  Abdominal pain. Brief Narrative:  Melanie Olson is a 50 y.o. female with medical history significant for history of obesity s/p roux-en0 y in 2005, gastric ulcer, GERD on PPI , chronic constipation, abdominoplasty in 2011, cholecystectomy, upper endoscopy in 2014 presents to the ED for chief concern of abdominal pain.   Patient has severe constipation, last bowel movement was 2/3. She did not notice any black stool or bleeding.  No nausea vomiting. Her initial hemoglobin was 6.7, she received 2 units PRBC.  Assessment & Plan:   Active Problems:   Severe anemia   Acute anemia  #1.  Acute on chronic anemia. Patient does not have any rectal bleeding black stool in the past.  She has some abdominal pain, could have peptic ulcer disease.  Abdominal pain could also be due to severe constipation. Patient has been evaluated by GI, planning for EGD and colonoscopy. I also check hepatic panel, LDH, haptoglobin, reticulocyte count.  Also check iron B12 level. She is receiving a second unit of PRBC transfusion for hemoglobin 6.9 now. Recheck a CBC tomorrow.  2.  Severe constipation. Patient will be receiving bowel prep tonight.  3.  Hypokalemia Repleted.      DVT prophylaxis: SCDs Code Status: Full Family Communication:  Disposition Plan:  .   Status is: Inpatient  Remains inpatient appropriate because:Inpatient level of care appropriate due to severity of illness   Dispo: The patient is from: Home              Anticipated d/c is to: Home              Anticipated d/c date is: 1 day              Patient currently is not medically stable to d/c.   Difficult to place patient No        No intake/output data recorded. No intake/output data recorded.     Consultants:   GI  Procedures: None  Antimicrobials:  None  Subjective: Patient complaining of constipation, abdominal pain is better.  No nausea vomiting. No fever or chills No short of breath or cough. No dysuria or hematuria.  Objective: Vitals:   03/22/20 1000 03/22/20 1100 03/22/20 1225 03/22/20 1249  BP: 100/61 (!) 102/53 (!) 101/54 106/64  Pulse: 74 78 94 94  Resp: 18 (!) 23 17 18   Temp:   98.4 F (36.9 C) 98.3 F (36.8 C)  TempSrc:   Oral Oral  SpO2: 95% 97% 97% 99%  Weight:      Height:       No intake or output data in the 24 hours ending 03/22/20 1402 Filed Weights   03/21/20 1124  Weight: 78.5 kg    Examination:  General exam: Appears calm and comfortable  Respiratory system: Clear to auscultation. Respiratory effort normal. Cardiovascular system: S1 & S2 heard, RRR. No JVD, murmurs, rubs, gallops or clicks. No pedal edema. Gastrointestinal system: Abdomen is nondistended, soft and nontender. No organomegaly or masses felt. Normal bowel sounds heard. Central nervous system: Alert and oriented. No focal neurological deficits. Extremities: Symmetric 5 x 5 power. Skin: No rashes, lesions or ulcers Psychiatry: Judgement and insight appear normal. Mood & affect appropriate.     Data Reviewed: I have personally reviewed following labs and imaging studies  CBC: Recent Labs  Lab 03/21/20 1130 03/21/20 2119  03/22/20 0312  WBC 5.1 6.7 5.8  HGB 6.7* 7.4* 6.9*  HCT 24.7* 26.6* 24.6*  MCV 64.8* 68.9* 68.7*  PLT 377 311 299   Basic Metabolic Panel: Recent Labs  Lab 03/21/20 1130 03/22/20 0312  NA 135 137  K 3.4* 3.1*  CL 102 106  CO2 22 24  GLUCOSE 98 91  BUN 9 8  CREATININE 0.53 0.47  CALCIUM 8.6* 7.9*   GFR: Estimated Creatinine Clearance: 82.6 mL/min (by C-G formula based on SCr of 0.47 mg/dL). Liver Function Tests: Recent Labs  Lab 03/21/20 1130  AST 20  ALT 20  ALKPHOS 91  BILITOT 1.0  PROT 6.9  ALBUMIN 3.7   Recent Labs  Lab 03/21/20 1130  LIPASE 25   No results for input(s):  AMMONIA in the last 168 hours. Coagulation Profile: No results for input(s): INR, PROTIME in the last 168 hours. Cardiac Enzymes: No results for input(s): CKTOTAL, CKMB, CKMBINDEX, TROPONINI in the last 168 hours. BNP (last 3 results) No results for input(s): PROBNP in the last 8760 hours. HbA1C: No results for input(s): HGBA1C in the last 72 hours. CBG: No results for input(s): GLUCAP in the last 168 hours. Lipid Profile: No results for input(s): CHOL, HDL, LDLCALC, TRIG, CHOLHDL, LDLDIRECT in the last 72 hours. Thyroid Function Tests: Recent Labs    03/22/20 0312  TSH 2.473   Anemia Panel: No results for input(s): VITAMINB12, FOLATE, FERRITIN, TIBC, IRON, RETICCTPCT in the last 72 hours. Sepsis Labs: No results for input(s): PROCALCITON, LATICACIDVEN in the last 168 hours.  Recent Results (from the past 240 hour(s))  SARS CORONAVIRUS 2 (TAT 6-24 HRS) Nasopharyngeal Nasopharyngeal Swab     Status: None   Collection Time: 03/21/20  2:53 PM   Specimen: Nasopharyngeal Swab  Result Value Ref Range Status   SARS Coronavirus 2 NEGATIVE NEGATIVE Final    Comment: (NOTE) SARS-CoV-2 target nucleic acids are NOT DETECTED.  The SARS-CoV-2 RNA is generally detectable in upper and lower respiratory specimens during the acute phase of infection. Negative results do not preclude SARS-CoV-2 infection, do not rule out co-infections with other pathogens, and should not be used as the sole basis for treatment or other patient management decisions. Negative results must be combined with clinical observations, patient history, and epidemiological information. The expected result is Negative.  Fact Sheet for Patients: HairSlick.no  Fact Sheet for Healthcare Providers: quierodirigir.com  This test is not yet approved or cleared by the [place] FDA and  has been authorized for detection and/or diagnosis of SARS-CoV-2 by FDA under an  Emergency Use Authorization (EUA). This EUA will remain  in effect (meaning this test can be used) for the duration of the COVID-19 declaration under Se ction 564(b)(1) of the Act, 21 U.S.C. section 360bbb-3(b)(1), unless the authorization is terminated or revoked sooner.  Performed at [facility], [address] [place], [place] [address]          Radiology Studies: CT Abdomen Pelvis W Contrast  Result Date: 03/21/2020 CLINICAL DATA:  Abdominal pain EXAM: CT ABDOMEN AND PELVIS WITH CONTRAST TECHNIQUE: Multidetector CT imaging of the abdomen and pelvis was performed using the standard protocol following bolus administration of intravenous contrast. Oral contrast was also administered. CONTRAST:  OMNIPAQUE IOHEXOL 300 MG/ML  SOLN COMPARISON:  June 01, 2019 FINDINGS: Lower chest: Lung bases are clear. There is a stable right epicardial cyst measuring 2.6 x 1.3 cm. Hepatobiliary: There is hepatic steatosis. Beyond hepatic steatosis, no focal liver lesions  are appreciable. Gallbladder is absent. The common hepatic duct remains prominent at 10 mm, stable. No biliary duct mass or calculus evident by CT. Pancreas: No pancreatic mass or inflammatory focus evident. Spleen: No splenic lesions are evident. Adrenals/Urinary Tract: Adrenals bilaterally appear normal. There is a cyst measuring 8 mm in the lateral mid right kidney. There are parapelvic cysts on the right, largest measuring 2.5 x 1.5 cm. There is a 1 x 1 cm parapelvic cyst on the left. There is a cyst medial to the renal pelvis in the mid left kidney measuring 7 x 7 mm. There is an extrarenal pelvis on each side, an anatomic variant. There is no hydronephrosis on either side. There is no evident renal or ureteral calculus on either side. Urinary bladder is midline with wall thickness within normal limits. Stomach/Bowel: Patient is status post gastric bypass procedure. No wall thickening or fluid in perioperative regions noted. There  is diffuse stool throughout much of the colon. There is no appreciable diverticular disease. No appreciable bowel wall or mesenteric thickening evident. There is no appreciable bowel obstruction. The terminal ileum appears normal. There is no appreciable free air or portal venous air. Vascular/Lymphatic: There is no abdominal aortic aneurysm. No arterial vascular lesions are evident. Major venous structures appear patent. There is no evident adenopathy in the abdomen or pelvis. Scattered subcentimeter mesenteric lymph nodes are considered nonspecific. Reproductive: Uterus is anteverted. There is a decreased attenuation mass in the volar aspect of the uterus measuring 3.6 x 3.6 cm, an apparent leiomyoma. This apparent leiomyoma displaces the endometrium slightly dorsally. No adnexal masses identified beyond physiologic follicles. Intrauterine device no longer present. Other: There is no periappendiceal region inflammatory change. No abscess or ascites is evident in the abdomen or pelvis. Musculoskeletal: No blastic or lytic bone lesions. No abdominal wall or intramuscular lesions are evident. IMPRESSION: 1. Fairly diffuse stool throughout much of the colon. No bowel wall thickening or bowel obstruction. Status post gastric bypass procedure without complicating features evident. 2. Hepatic steatosis. Gallbladder absent. Borderline common hepatic and proximal common bile duct dilatation without mass or calculus evident by CT. 3. Leiomyoma within the mid uterus which causes mild impression upon the endometrium. Intrauterine device no longer present. 4. No renal or ureteral calculus. No hydronephrosis. Urinary bladder wall thickness normal. 5.  Small stable epicardial cyst on the right. Electronically Signed   By: Bretta Bang III M.D.   On: 03/21/2020 16:18        Scheduled Meds: . amitriptyline  25 mg Oral QHS  . polyethylene glycol  17 g Oral BID   Continuous Infusions: . sodium chloride    . sodium  chloride 125 mL/hr at 03/21/20 2232  . pantoprozole (PROTONIX) infusion 8 mg/hr (03/21/20 1929)     LOS: 0 days    Time spent: 28 minutes    Marrion Coy, MD [facility]   To contact the attending provider between 7A-7P or the covering provider during after hours 7P-7A, please log into the web site [email or web address] and access using universal [place] password for that web site. If you do not have the password, please call the hospital operator.  03/22/2020, 2:02 PM

## 2020-03-22 NOTE — Consult Note (Signed)
Midge Minium, MD [facility]  [address] [place], [place] [address] Phone: [phone] Fax : [phone]  Consultation  Referring Provider:     Dr. Chipper Herb Primary Care Physician:  [facility] Primary Gastroenterologist:  Dr. Tawni Carnes         Reason for Consultation:     Anemia  Date of Admission:  03/21/2020 Date of Consultation:  03/22/2020         HPI:   Melanie Olson is a 50 y.o. female who has a history of obesity with gastric bypass surgery. The patient reports that she has had ulcers in the past and has had anemia.  The patient came to the hospital after she states that she had not had a bowel movement in 9 days and had abdominal discomfort.  She still has not had any bowel movements and states that she try to use enemas at home.  The patient had a EGD and colonoscopy in June 2021, 8 months ago.  The patient denies any black stools or bloody stools.  On admission to the hospital the patient was found to have a hemoglobin of 6.7 and was transfused blood.The patient did not have IV access and had a PICC line placed.  The patient was then transfused to a blood count of 7.4 with a repeat hemoglobin at 3 AM of 6.9.  The patient states that she feels much better today than she did prior to coming in.  She also denies any bowel movements since coming to the hospital.  As stated above she states that it has been 9 days without a bowel movement. She was also told that she had blood in her stool on a rectal exam this admission.  She denies seeing any blood in her stool.  Past Medical History:  Diagnosis Date  . Abdominal wall ulcer (HCC)   . Depression   . GERD (gastroesophageal reflux disease)   . HSV (herpes simplex virus) infection   . Migraine     Past Surgical History:  Procedure Laterality Date  . CHOLECYSTECTOMY    . COLONOSCOPY WITH PROPOFOL N/A 07/11/2019   Procedure: COLONOSCOPY WITH PROPOFOL;  Surgeon: Pasty Spillers, MD;  Location: [facility]  ENDOSCOPY;  Service: Endoscopy;  Laterality: N/A;  . ESOPHAGOGASTRODUODENOSCOPY (EGD) WITH PROPOFOL N/A 07/11/2019   Procedure: ESOPHAGOGASTRODUODENOSCOPY (EGD) WITH PROPOFOL;  Surgeon: Pasty Spillers, MD;  Location: [facility] ENDOSCOPY;  Service: Endoscopy;  Laterality: N/A;  . GASTRIC BYPASS  2006  . tummy tuck      Prior to Admission medications   Medication Sig Start Date End Date Taking? Authorizing Provider  acyclovir (ZOVIRAX) 200 MG capsule Take 2 capsules by mouth in the morning and at bedtime.   Yes [provider]  amitriptyline (ELAVIL) 25 MG tablet Take 25 mg by mouth at bedtime.   Yes [provider]  cyclobenzaprine (FLEXERIL) 10 MG tablet Take 10 mg by mouth 3 (three) times daily as needed for muscle spasms.   Yes [provider]  pantoprazole (PROTONIX) 40 MG tablet Take 1 tablet (40 mg total) by mouth 2 (two) times daily. 06/01/19  Yes Emily Filbert, MD  acetaminophen (TYLENOL) 650 MG CR tablet Take 650 mg by mouth every 8 (eight) hours as needed for pain.    [provider]  ferrous sulfate (EQL SLOW RELEASE IRON) 160 (50 Fe) MG TBCR SR tablet Take 1 tablet (160 mg total) by mouth every other day. Patient not taking: Reported on  03/21/2020 06/01/19   Emily FilbertWilliams, Jonathan E, MD  gabapentin (NEURONTIN) 300 MG capsule Take 1 capsule by mouth 2 (two) times daily. Patient not taking: Reported on 03/21/2020 06/30/18   [provider]    No family history on file.   Social History   Tobacco Use  . Smoking status: Never Smoker  . Smokeless tobacco: Never Used  Vaping Use  . Vaping Use: Never used  Substance Use Topics  . Alcohol use: No  . Drug use: No    Allergies as of 03/21/2020 - Review Complete 03/21/2020  Allergen Reaction Noted  . Penicillins Rash 10/03/2014    Review of Systems:    All systems reviewed and negative except where noted in HPI.   Physical Exam:  Vital signs in last 24 hours: Temp:  [98.3 F (36.8  C)-99 F (37.2 C)] 98.3 F (36.8 C) (02/13 1249) Pulse Rate:  [65-97] 94 (02/13 1249) Resp:  [10-23] 18 (02/13 1249) BP: (80-119)/(49-74) 106/64 (02/13 1249) SpO2:  [91 %-100 %] 99 % (02/13 1249)   General:   Pleasant, cooperative in NAD Head:  Normocephalic and atraumatic. Eyes:   No icterus.   Conjunctiva pink. PERRLA. Ears:  Normal auditory acuity. Neck:  Supple; no masses or thyroidomegaly Lungs: Respirations even and unlabored. Lungs clear to auscultation bilaterally.   No wheezes, crackles, or rhonchi.  Heart:  Regular rate and rhythm;  Without murmur, clicks, rubs or gallops Abdomen:  Soft, nondistended, nontender. Normal bowel sounds. No appreciable masses or hepatomegaly.  No rebound or guarding.  Rectal:  Not performed. Msk:  Symmetrical without gross deformities.    Extremities:  Without edema, cyanosis or clubbing. Neurologic:  Alert and oriented x3;  grossly normal neurologically. Skin:  Intact without significant lesions or rashes. Cervical Nodes:  No significant cervical adenopathy. Psych:  Alert and cooperative. Normal affect.  LAB RESULTS: Recent Labs    03/21/20 1130 03/21/20 2119 03/22/20 0312  WBC 5.1 6.7 5.8  HGB 6.7* 7.4* 6.9*  HCT 24.7* 26.6* 24.6*  PLT 377 311 299   BMET Recent Labs    03/21/20 1130 03/22/20 0312  NA 135 137  K 3.4* 3.1*  CL 102 106  CO2 22 24  GLUCOSE 98 91  BUN 9 8  CREATININE 0.53 0.47  CALCIUM 8.6* 7.9*   LFT Recent Labs    03/21/20 1130  PROT 6.9  ALBUMIN 3.7  AST 20  ALT 20  ALKPHOS 91  BILITOT 1.0   PT/INR No results for input(s): LABPROT, INR in the last 72 hours.  STUDIES: CT Abdomen Pelvis W Contrast  Result Date: 03/21/2020 CLINICAL DATA:  Abdominal pain EXAM: CT ABDOMEN AND PELVIS WITH CONTRAST TECHNIQUE: Multidetector CT imaging of the abdomen and pelvis was performed using the standard protocol following bolus administration of intravenous contrast. Oral contrast was also administered. CONTRAST:   100mL OMNIPAQUE IOHEXOL 300 MG/ML  SOLN COMPARISON:  June 01, 2019 FINDINGS: Lower chest: Lung bases are clear. There is a stable right epicardial cyst measuring 2.6 x 1.3 cm. Hepatobiliary: There is hepatic steatosis. Beyond hepatic steatosis, no focal liver lesions are appreciable. Gallbladder is absent. The common hepatic duct remains prominent at 10 mm, stable. No biliary duct mass or calculus evident by CT. Pancreas: No pancreatic mass or inflammatory focus evident. Spleen: No splenic lesions are evident. Adrenals/Urinary Tract: Adrenals bilaterally appear normal. There is a cyst measuring 8 mm in the lateral mid right kidney. There are parapelvic cysts on the right, largest measuring 2.5 x  1.5 cm. There is a 1 x 1 cm parapelvic cyst on the left. There is a cyst medial to the renal pelvis in the mid left kidney measuring 7 x 7 mm. There is an extrarenal pelvis on each side, an anatomic variant. There is no hydronephrosis on either side. There is no evident renal or ureteral calculus on either side. Urinary bladder is midline with wall thickness within normal limits. Stomach/Bowel: Patient is status post gastric bypass procedure. No wall thickening or fluid in perioperative regions noted. There is diffuse stool throughout much of the colon. There is no appreciable diverticular disease. No appreciable bowel wall or mesenteric thickening evident. There is no appreciable bowel obstruction. The terminal ileum appears normal. There is no appreciable free air or portal venous air. Vascular/Lymphatic: There is no abdominal aortic aneurysm. No arterial vascular lesions are evident. Major venous structures appear patent. There is no evident adenopathy in the abdomen or pelvis. Scattered subcentimeter mesenteric lymph nodes are considered nonspecific. Reproductive: Uterus is anteverted. There is a decreased attenuation mass in the volar aspect of the uterus measuring 3.6 x 3.6 cm, an apparent leiomyoma. This apparent  leiomyoma displaces the endometrium slightly dorsally. No adnexal masses identified beyond physiologic follicles. Intrauterine device no longer present. Other: There is no periappendiceal region inflammatory change. No abscess or ascites is evident in the abdomen or pelvis. Musculoskeletal: No blastic or lytic bone lesions. No abdominal wall or intramuscular lesions are evident. IMPRESSION: 1. Fairly diffuse stool throughout much of the colon. No bowel wall thickening or bowel obstruction. Status post gastric bypass procedure without complicating features evident. 2. Hepatic steatosis. Gallbladder absent. Borderline common hepatic and proximal common bile duct dilatation without mass or calculus evident by CT. 3. Leiomyoma within the mid uterus which causes mild impression upon the endometrium. Intrauterine device no longer present. 4. No renal or ureteral calculus. No hydronephrosis. Urinary bladder wall thickness normal. 5.  Small stable epicardial cyst on the right. Electronically Signed   By: Bretta Bang III M.D.   On: 03/21/2020 16:18      Impression / Plan:   Assessment: Active Problems:   Severe anemia   Melanie Olson is a 49 y.o. y/o female with who came in with constipation and inability to move her bowels the last 5 days.  She was noted to have significant anemia and was transfused.  The patient's hemoglobin at 3 AM was 6.9 and she has not had it repeated since.   Plan:  The patient will be set up for an EGD for tomorrow.  The patient had a colonoscopy 8 months ago and there is no reason to repeat the colonoscopy at this time.  The patient's possibilities for the source of her anemia may be an anastomotic ulcer versus peptic ulcer disease versus decreased iron absorption.  The patient's Hemoccult stools can be from hemorrhoids since she states she has been straining Stool occult blood test has no role in evaluation of anemia and is a test for colon cancer screening and is  inappropriate to be used for evaluation of anemia, as a negative or positive test would not change evaluation. The patient will be put on a full liquid diet today and has been explained the Carmin Richmond do an upper endoscopy on her tomorrow.  The patient has been explained the plan and agrees with it.  Thank you for involving me in the care of this patient.      LOS: 0 days   Midge Minium, MD, [facility]  03/22/2020, 1:42 PM,  Pager [phone] 7am-5pm  Check AMION for 5pm -7am coverage and on weekends   Note: This dictation was prepared with Dragon dictation along with smaller phrase technology. Any transcriptional errors that result from this process are unintentional.

## 2020-03-22 NOTE — ED Notes (Signed)
Pt awake, denies needs. Skin pwd.

## 2020-03-23 ENCOUNTER — Inpatient Hospital Stay: Payer: Self-pay | Admitting: Anesthesiology

## 2020-03-23 ENCOUNTER — Inpatient Hospital Stay: Payer: Self-pay

## 2020-03-23 ENCOUNTER — Encounter
Admission: EM | Disposition: A | Discharge status: Home / Self Care | Payer: Self-pay | Source: Home / Self Care | Attending: Internal Medicine

## 2020-03-23 ENCOUNTER — Encounter: Payer: Self-pay | Admitting: Internal Medicine

## 2020-03-23 DIAGNOSIS — K5900 Constipation, unspecified: Secondary | ICD-10-CM

## 2020-03-23 DIAGNOSIS — T85898A Other specified complication of other internal prosthetic devices, implants and grafts, initial encounter: Secondary | ICD-10-CM

## 2020-03-23 DIAGNOSIS — D509 Iron deficiency anemia, unspecified: Secondary | ICD-10-CM

## 2020-03-23 DIAGNOSIS — K289 Gastrojejunal ulcer, unspecified as acute or chronic, without hemorrhage or perforation: Secondary | ICD-10-CM

## 2020-03-23 DIAGNOSIS — D649 Anemia, unspecified: Secondary | ICD-10-CM

## 2020-03-23 HISTORY — PX: ESOPHAGOGASTRODUODENOSCOPY (EGD) WITH PROPOFOL: SHX5813

## 2020-03-23 LAB — TYPE AND SCREEN
ABO/RH(D): A POS
Antibody Screen: NEGATIVE
Unit division: 0
Unit division: 0

## 2020-03-23 LAB — CBC WITH DIFFERENTIAL/PLATELET
Abs Immature Granulocytes: 0.01 10*3/uL (ref 0.00–0.07)
Basophils Absolute: 0 10*3/uL (ref 0.0–0.1)
Basophils Relative: 0 %
Eosinophils Absolute: 0.2 10*3/uL (ref 0.0–0.5)
Eosinophils Relative: 5 %
HCT: 27.8 % — ABNORMAL LOW (ref 36.0–46.0)
Hemoglobin: 8.1 g/dL — ABNORMAL LOW (ref 12.0–15.0)
Immature Granulocytes: 0 %
Lymphocytes Relative: 28 %
Lymphs Abs: 0.9 10*3/uL (ref 0.7–4.0)
MCH: 20.2 pg — ABNORMAL LOW (ref 26.0–34.0)
MCHC: 29.1 g/dL — ABNORMAL LOW (ref 30.0–36.0)
MCV: 69.3 fL — ABNORMAL LOW (ref 80.0–100.0)
Monocytes Absolute: 0.4 10*3/uL (ref 0.1–1.0)
Monocytes Relative: 12 %
Neutro Abs: 1.7 10*3/uL (ref 1.7–7.7)
Neutrophils Relative %: 55 %
Platelets: 343 10*3/uL (ref 150–400)
RBC: 4.01 MIL/uL (ref 3.87–5.11)
RDW: 22.4 % — ABNORMAL HIGH (ref 11.5–15.5)
WBC: 3.2 10*3/uL — ABNORMAL LOW (ref 4.0–10.5)
nRBC: 0 % (ref 0.0–0.2)

## 2020-03-23 LAB — HEPATIC FUNCTION PANEL
ALT: 58 U/L — ABNORMAL HIGH (ref 0–44)
AST: 40 U/L (ref 15–41)
Albumin: 2.9 g/dL — ABNORMAL LOW (ref 3.5–5.0)
Alkaline Phosphatase: 155 U/L — ABNORMAL HIGH (ref 38–126)
Bilirubin, Direct: 0.3 mg/dL — ABNORMAL HIGH (ref 0.0–0.2)
Indirect Bilirubin: 0.5 mg/dL (ref 0.3–0.9)
Total Bilirubin: 0.8 mg/dL (ref 0.3–1.2)
Total Protein: 5.8 g/dL — ABNORMAL LOW (ref 6.5–8.1)

## 2020-03-23 LAB — BPAM RBC
Blood Product Expiration Date: 202203112359
Blood Product Expiration Date: 202203132359
ISSUE DATE / TIME: 202202121743
ISSUE DATE / TIME: 202202131229
Unit Type and Rh: 6200
Unit Type and Rh: 6200

## 2020-03-23 LAB — BASIC METABOLIC PANEL
Anion gap: 9 (ref 5–15)
BUN: 6 mg/dL (ref 6–20)
CO2: 25 mmol/L (ref 22–32)
Calcium: 8.1 mg/dL — ABNORMAL LOW (ref 8.9–10.3)
Chloride: 106 mmol/L (ref 98–111)
Creatinine, Ser: 0.54 mg/dL (ref 0.44–1.00)
GFR, Estimated: >60 mL/min (ref >60)
Glucose, Bld: 90 mg/dL (ref 70–99)
Potassium: 3.4 mmol/L — ABNORMAL LOW (ref 3.5–5.1)
Sodium: 140 mmol/L (ref 135–145)

## 2020-03-23 LAB — RETIC PANEL
Immature Retic Fract: 36.3 % — ABNORMAL HIGH (ref 2.3–15.9)
RBC.: 4.04 MIL/uL (ref 3.87–5.11)
Retic Count, Absolute: 50.9 10*3/uL (ref 19.0–186.0)
Retic Ct Pct: 1.3 % (ref 0.4–3.1)
Reticulocyte Hemoglobin: 18 pg — ABNORMAL LOW (ref >27.9)

## 2020-03-23 LAB — HEMOGLOBIN
Hemoglobin: 8.4 g/dL — ABNORMAL LOW (ref 12.0–15.0)
Hemoglobin: 8.3 g/dL — ABNORMAL LOW (ref 12.0–15.0)

## 2020-03-23 LAB — MAGNESIUM: Magnesium: 2 mg/dL (ref 1.7–2.4)

## 2020-03-23 LAB — LACTATE DEHYDROGENASE: LDH: 128 U/L (ref 98–192)

## 2020-03-23 SURGERY — ESOPHAGOGASTRODUODENOSCOPY (EGD) WITH PROPOFOL
Anesthesia: General

## 2020-03-23 MED ORDER — SODIUM CHLORIDE 0.9 % IV SOLN
300.0000 mg | Freq: Once | INTRAVENOUS | Status: AC
  Administered 2020-03-23: 300 mg via INTRAVENOUS
  Filled 2020-03-23: qty 15

## 2020-03-23 MED ORDER — POTASSIUM CHLORIDE 10 MEQ/100ML IV SOLN
10.0000 meq | INTRAVENOUS | Status: AC
  Administered 2020-03-23 (×2): 10 meq via INTRAVENOUS
  Filled 2020-03-23: qty 100

## 2020-03-23 MED ORDER — PROPOFOL 500 MG/50ML IV EMUL
INTRAVENOUS | Status: DC | PRN
  Administered 2020-03-23: 75 ug/kg/min via INTRAVENOUS

## 2020-03-23 MED ORDER — LIDOCAINE HCL (PF) 2 % IJ SOLN
INTRAMUSCULAR | Status: AC
  Filled 2020-03-23: qty 5

## 2020-03-23 MED ORDER — MIDAZOLAM HCL 5 MG/5ML IJ SOLN
INTRAMUSCULAR | Status: DC | PRN
  Administered 2020-03-23: 2 mg via INTRAVENOUS

## 2020-03-23 MED ORDER — SUCRALFATE 1 GM/10ML PO SUSP
1.0000 g | Freq: Four times a day (QID) | ORAL | Status: DC
  Administered 2020-03-23 – 2020-03-24 (×4): 1 g via ORAL
  Filled 2020-03-23 (×5): qty 10

## 2020-03-23 MED ORDER — LACTULOSE 10 GM/15ML PO SOLN
20.0000 g | Freq: Once | ORAL | Status: AC
  Administered 2020-03-23: 20 g via ORAL
  Filled 2020-03-23: qty 30

## 2020-03-23 MED ORDER — LACTULOSE 10 GM/15ML PO SOLN
20.0000 g | Freq: Two times a day (BID) | ORAL | Status: DC
  Administered 2020-03-23: 20 g via ORAL
  Filled 2020-03-23 (×2): qty 30

## 2020-03-23 MED ORDER — SODIUM CHLORIDE 0.9 % IV SOLN: INTRAVENOUS | Status: DC

## 2020-03-23 MED ORDER — PROPOFOL 10 MG/ML IV BOLUS
INTRAVENOUS | Status: DC | PRN
  Administered 2020-03-23: 30 mg via INTRAVENOUS
  Administered 2020-03-23: 20 mg via INTRAVENOUS

## 2020-03-23 MED ORDER — FENTANYL CITRATE (PF) 100 MCG/2ML IJ SOLN
INTRAMUSCULAR | Status: DC | PRN
  Administered 2020-03-23 (×2): 50 ug via INTRAVENOUS

## 2020-03-23 MED ORDER — LIDOCAINE HCL (PF) 2 % IJ SOLN
INTRAMUSCULAR | Status: DC | PRN
  Administered 2020-03-23: 60 mg

## 2020-03-23 MED ORDER — PANTOPRAZOLE SODIUM 40 MG IV SOLR
40.0000 mg | Freq: Two times a day (BID) | INTRAVENOUS | Status: DC
  Administered 2020-03-23 – 2020-03-24 (×2): 40 mg via INTRAVENOUS
  Filled 2020-03-23 (×2): qty 40

## 2020-03-23 MED ORDER — MIDAZOLAM HCL 2 MG/2ML IJ SOLN
INTRAMUSCULAR | Status: AC
  Filled 2020-03-23: qty 2

## 2020-03-23 MED ORDER — FENTANYL CITRATE (PF) 100 MCG/2ML IJ SOLN
INTRAMUSCULAR | Status: AC
  Filled 2020-03-23: qty 2

## 2020-03-23 MED ORDER — BISACODYL 10 MG RE SUPP
10.0000 mg | Freq: Once | RECTAL | Status: AC
  Administered 2020-03-23: 10 mg via RECTAL
  Filled 2020-03-23: qty 1

## 2020-03-23 NOTE — Progress Notes (Signed)
PROGRESS NOTE    Melanie Olson  ELF:[id number] DOB: 09/19/1970 DOA: 03/21/2020 PCP: [facility]   Chief complaint abdominal pain. Brief Narrative:  Melanie Olson a 50 Olson medical history significant forhistory of obesity s/p roux-en0 y in 2005, gastric ulcer, GERD on PPI , chronic constipation, abdominoplasty in 2011, cholecystectomy, upper endoscopy in 2014 presents to the ED for chief concern of abdominal pain.  Patient has severe constipation, last bowel movement was 2/3. She did not notice any black stool or bleeding.  No nausea vomiting. Her initial hemoglobin was 6.7, she received 2 units PRBC.   Assessment & Plan:   Active Problems:   Severe anemia   Acute anemia   Constipation   Ulcer at site of surgical anastomosis following bypass of stomach  #1.  Acute blood loss anemia.  Secondary to gastric ulcer. Gastric ulcer at surgical anastomosis site. Iron deficient anemia. Patient will be giving iron infusion.  EGD showed gastric ulcer at the surgical anastomosis site. Continue IV Protonix for today.  Transition to oral tomorrow.  2.  Severe constipation. Patient did not receive bowel prep yesterday.  We will schedule lactulose twice a day and change to as needed once have a bowel movement.  3.  Hypokalemia. Repleted with IV again.  #4.  Nonalcoholic steatohepatitis with liver function changes. Right upper quadrant ultrasound showed possible fatty liver.     DVT prophylaxis: SCDs Code Status: Full Family Communication:  Disposition Plan:  .   Status is: Inpatient  Remains inpatient appropriate because:Inpatient level of care appropriate due to severity of illness   Dispo: The patient is from: Home              Anticipated d/c is to: Home              Anticipated d/c date is: 1 day              Patient currently is not medically stable to d/c.   Difficult to place patient No        I/O last 3 completed  shifts: In: 977.7 [I.V.:627.7; Blood:350] Out: -  Total I/O In: 300 [I.V.:300] Out: 0      Consultants:   GI  Procedures: EGD  Antimicrobials: None  Subjective: Still has significant constipation, still no bowel movement.  Abdominal pain has resolved.  No nausea vomiting. Denies any short of breath or cough. No fever chills pain No dysuria or hematuria.  Objective: Vitals:   03/23/20 1426 03/23/20 1436 03/23/20 1446 03/23/20 1456  BP: 107/60 118/80 121/73 130/80  Pulse: 75 75 72 73  Resp: (!) 24 (!) 24 (!) 26 (!) 26  Temp: (!) 97.3 F (36.3 C)     TempSrc: Temporal     SpO2: 93% 100% 100% 100%  Weight:      Height:        Intake/Output Summary (Last 24 hours) at 03/23/2020 1615 Last data filed at 03/23/2020 1420 Gross per 24 hour  Intake 927.68 ml  Output 0 ml  Net 927.68 ml   Filed Weights   03/21/20 1124 03/23/20 1334  Weight: 78.5 kg 78.5 kg    Examination:  General exam: Appears calm and comfortable  Respiratory system: Clear to auscultation. Respiratory effort normal. Cardiovascular system: S1 & S2 heard, RRR. No JVD, murmurs, rubs, gallops or clicks. No pedal edema. Gastrointestinal system: Abdomen is nondistended, soft and nontender. No organomegaly or masses felt. Normal bowel sounds heard. Central nervous system: Alert  and oriented. No focal neurological deficits. Extremities: Symmetric 5 x 5 power. Skin: No rashes, lesions or ulcers Psychiatry: Judgement and insight appear normal. Mood & affect appropriate.     Data Reviewed: I have personally reviewed following labs and imaging studies  CBC: Recent Labs  Lab 03/21/20 1130 03/21/20 2119 03/22/20 0312 03/22/20 1553 03/22/20 2148 03/23/20 0434 03/23/20 1013 03/23/20 1323  WBC 5.1 6.7 5.8  --   --  3.2*  --   --   NEUTROABS  --   --   --   --   --  1.7  --   --   HGB 6.7* 7.4* 6.9* 8.2* 8.0* 8.1* 8.4* 8.3*  HCT 24.7* 26.6* 24.6*  --   --  27.8*  --   --   MCV 64.8* 68.9* 68.7*  --    --  69.3*  --   --   PLT 377 311 299  --   --  343  --   --    Basic Metabolic Panel: Recent Labs  Lab 03/21/20 1130 03/22/20 0312 03/23/20 0434  NA 135 137 140  K 3.4* 3.1* 3.4*  CL 102 106 106  CO2 22 24 25   GLUCOSE 98 91 90  BUN 9 8 6   CREATININE 0.53 0.47 0.54  CALCIUM 8.6* 7.9* 8.1*  MG  --   --  2.0   GFR: Estimated Creatinine Clearance: 82.6 mL/min (by C-G formula based on SCr of 0.54 mg/dL). Liver Function Tests: Recent Labs  Lab 03/21/20 1130 03/23/20 0434  AST 20 40  ALT 20 58*  ALKPHOS 91 155*  BILITOT 1.0 0.8  PROT 6.9 5.8*  ALBUMIN 3.7 2.9*   Recent Labs  Lab 03/21/20 1130  LIPASE 25   No results for input(s): AMMONIA in the last 168 hours. Coagulation Profile: No results for input(s): INR, PROTIME in the last 168 hours. Cardiac Enzymes: No results for input(s): CKTOTAL, CKMB, CKMBINDEX, TROPONINI in the last 168 hours. BNP (last 3 results) No results for input(s): PROBNP in the last 8760 hours. HbA1C: No results for input(s): HGBA1C in the last 72 hours. CBG: No results for input(s): GLUCAP in the last 168 hours. Lipid Profile: No results for input(s): CHOL, HDL, LDLCALC, TRIG, CHOLHDL, LDLDIRECT in the last 72 hours. Thyroid Function Tests: Recent Labs    03/22/20 0312  TSH 2.473   Anemia Panel: Recent Labs    03/21/20 2119 03/22/20 0312 03/23/20 0434  VITAMINB12 871  --   --   TIBC  --  409  --   IRON  --  22*  --   RETICCTPCT  --   --  1.3   Sepsis Labs: No results for input(s): PROCALCITON, LATICACIDVEN in the last 168 hours.  Recent Results (from the past 240 hour(s))  SARS CORONAVIRUS 2 (TAT 6-24 HRS) Nasopharyngeal Nasopharyngeal Swab     Status: None   Collection Time: 03/21/20  2:53 PM   Specimen: Nasopharyngeal Swab  Result Value Ref Range Status   SARS Coronavirus 2 NEGATIVE NEGATIVE Final    Comment: (NOTE) SARS-CoV-2 target nucleic acids are NOT DETECTED.  The SARS-CoV-2 RNA is generally detectable in upper and  lower respiratory specimens during the acute phase of infection. Negative results do not preclude SARS-CoV-2 infection, do not rule out co-infections with other pathogens, and should not be used as the sole basis for treatment or other patient management decisions. Negative results must be combined with clinical observations, patient history, and epidemiological information. The expected  result is Negative.  Fact Sheet for Patients: HairSlick.no  Fact Sheet for Healthcare Providers: quierodirigir.com  This test is not yet approved or cleared by the [place] FDA and  has been authorized for detection and/or diagnosis of SARS-CoV-2 by FDA under an Emergency Use Authorization (EUA). This EUA will remain  in effect (meaning this test can be used) for the duration of the COVID-19 declaration under Se ction 564(b)(1) of the Act, 21 U.S.C. section 360bbb-3(b)(1), unless the authorization is terminated or revoked sooner.  Performed at [facility], [address] [place], [place] [address]          Radiology Studies: US Abdomen Limited RUQ (LIVER/GB)  Result Date: 03/23/2020 CLINICAL DATA:  Hepatic cirrhosis. EXAM: ULTRASOUND ABDOMEN LIMITED RIGHT UPPER QUADRANT COMPARISON:  CT abdomen and pelvis March 21, 2020 FINDINGS: Gallbladder: Surgically absent Common bile duct: Diameter: 7 mm Liver: No focal lesion identified. Diffusely increased parenchymal echogenicity. Portal vein is patent on color Doppler imaging with normal direction of blood flow towards the liver. Other: None. IMPRESSION: 1. The echogenicity of the liver is increased. This is a nonspecific finding but is most commonly seen with fatty infiltration of the liver. There are no obvious focal liver lesions. 2. Prior cholecystectomy. Electronically Signed   By: Maudry Mayhew MD   On: 03/23/2020 12:31        Scheduled Meds: . amitriptyline  25 mg Oral QHS   . lactulose  20 g Oral BID  . polyethylene glycol  17 g Oral BID  . sucralfate  1 g Oral QID   Continuous Infusions: . sodium chloride       LOS: 1 day    Time spent: 27 minutes    Marrion Coy, MD [facility]   To contact the attending provider between 7A-7P or the covering provider during after hours 7P-7A, please log into the web site [email or web address] and access using universal [place] password for that web site. If you do not have the password, please call the hospital operator.  03/23/2020, 4:15 PM

## 2020-03-23 NOTE — Progress Notes (Signed)
Melanie Antigua, MD [address] [place], [place], [place], [address] [address], [place], [place], [place], [address] Phone: [phone]  Fax: [phone]   Subjective: Hemoglobin has responded to iron transfusion.  Patient is receiving IV iron today.  No evidence of bleeding.  Patient reports complete resolution of abdominal discomfort and pain that she presented with.  Still has not had a bowel movement.   Objective: Exam: Vital signs in last 24 hours: Vitals:   03/22/20 1535 03/22/20 1950 03/23/20 0432 03/23/20 0804  BP: 121/67 113/65 (!) 113/58 131/83  Pulse: 75 97 70 67  Resp: 18 20 18    Temp: 98.7 F (37.1 C) 98.3 F (36.8 C) 98.6 F (37 C) 98.2 F (36.8 C)  TempSrc: Oral   Oral  SpO2: 99% 97% 97% 100%  Weight:      Height:       Weight change:   Intake/Output Summary (Last 24 hours) at 03/23/2020 1032 Last data filed at 03/23/2020 0406 Gross per 24 hour  Intake 977.68 ml  Output --  Net 977.68 ml    General: No acute distress, AAO x3 Abd: Soft, NT/ND, No HSM Skin: Warm, no rashes Neck: Supple, Trachea midline   Lab Results: Lab Results  Component Value Date   WBC 3.2 (L) 03/23/2020   HGB 8.1 (L) 03/23/2020   HCT 27.8 (L) 03/23/2020   MCV 69.3 (L) 03/23/2020   PLT 343 03/23/2020   Micro Results: Recent Results (from the past 240 hour(s))  SARS CORONAVIRUS 2 (TAT 6-24 HRS) Nasopharyngeal Nasopharyngeal Swab     Status: None   Collection Time: 03/21/20  2:53 PM   Specimen: Nasopharyngeal Swab  Result Value Ref Range Status   SARS Coronavirus 2 NEGATIVE NEGATIVE Final    Comment: (NOTE) SARS-CoV-2 target nucleic acids are NOT DETECTED.  The SARS-CoV-2 RNA is generally detectable in upper and lower respiratory specimens during the acute phase of infection. Negative results do not preclude SARS-CoV-2 infection, do not rule out co-infections with other pathogens, and should not be used as the sole basis for treatment or other patient  management decisions. Negative results must be combined with clinical observations, patient history, and epidemiological information. The expected result is Negative.  Fact Sheet for Patients: SugarRoll.be  Fact Sheet for Healthcare Providers: https://www.woods-mathews.com/  This test is not yet approved or cleared by the [place] FDA and  has been authorized for detection and/or diagnosis of SARS-CoV-2 by FDA under an Emergency Use Authorization (EUA). This EUA will remain  in effect (meaning this test can be used) for the duration of the COVID-19 declaration under Se ction 564(b)(1) of the Act, 21 U.S.C. section 360bbb-3(b)(1), unless the authorization is terminated or revoked sooner.  Performed at [facility], [place] [address] [place], [place] [address]    Studies/Results: CT Abdomen Pelvis W Contrast  Result Date: 03/21/2020 CLINICAL DATA:  Abdominal pain EXAM: CT ABDOMEN AND PELVIS WITH CONTRAST TECHNIQUE: Multidetector CT imaging of the abdomen and pelvis was performed using the standard protocol following bolus administration of intravenous contrast. Oral contrast was also administered. CONTRAST:  110m OMNIPAQUE IOHEXOL 300 MG/ML  SOLN COMPARISON:  June 01, 2019 FINDINGS: Lower chest: Lung bases are clear. There is a stable right epicardial cyst measuring 2.6 x 1.3 cm. Hepatobiliary: There is hepatic steatosis. Beyond hepatic steatosis, no focal liver lesions are appreciable. Gallbladder is absent. The common hepatic duct remains prominent at 10 mm, stable. No biliary duct mass or calculus evident by  CT. Pancreas: No pancreatic mass or inflammatory focus evident. Spleen: No splenic lesions are evident. Adrenals/Urinary Tract: Adrenals bilaterally appear normal. There is a cyst measuring 8 mm in the lateral mid right kidney. There are parapelvic cysts on the right, largest measuring 2.5 x 1.5 cm. There is a 1 x 1 cm parapelvic cyst  on the left. There is a cyst medial to the renal pelvis in the mid left kidney measuring 7 x 7 mm. There is an extrarenal pelvis on each side, an anatomic variant. There is no hydronephrosis on either side. There is no evident renal or ureteral calculus on either side. Urinary bladder is midline with wall thickness within normal limits. Stomach/Bowel: Patient is status post gastric bypass procedure. No wall thickening or fluid in perioperative regions noted. There is diffuse stool throughout much of the colon. There is no appreciable diverticular disease. No appreciable bowel wall or mesenteric thickening evident. There is no appreciable bowel obstruction. The terminal ileum appears normal. There is no appreciable free air or portal venous air. Vascular/Lymphatic: There is no abdominal aortic aneurysm. No arterial vascular lesions are evident. Major venous structures appear patent. There is no evident adenopathy in the abdomen or pelvis. Scattered subcentimeter mesenteric lymph nodes are considered nonspecific. Reproductive: Uterus is anteverted. There is a decreased attenuation mass in the volar aspect of the uterus measuring 3.6 x 3.6 cm, an apparent leiomyoma. This apparent leiomyoma displaces the endometrium slightly dorsally. No adnexal masses identified beyond physiologic follicles. Intrauterine device no longer present. Other: There is no periappendiceal region inflammatory change. No abscess or ascites is evident in the abdomen or pelvis. Musculoskeletal: No blastic or lytic bone lesions. No abdominal wall or intramuscular lesions are evident. IMPRESSION: 1. Fairly diffuse stool throughout much of the colon. No bowel wall thickening or bowel obstruction. Status post gastric bypass procedure without complicating features evident. 2. Hepatic steatosis. Gallbladder absent. Borderline common hepatic and proximal common bile duct dilatation without mass or calculus evident by CT. 3. Leiomyoma within the mid  uterus which causes mild impression upon the endometrium. Intrauterine device no longer present. 4. No renal or ureteral calculus. No hydronephrosis. Urinary bladder wall thickness normal. 5.  Small stable epicardial cyst on the right. Electronically Signed   By: Lowella Grip III M.D.   On: 03/21/2020 16:18   Medications:  Scheduled Meds: . amitriptyline  25 mg Oral QHS  . polyethylene glycol  17 g Oral BID   Continuous Infusions: . sodium chloride    . iron sucrose 300 mg (03/23/20 0954)  . pantoprozole (PROTONIX) infusion 8 mg/hr (03/23/20 0406)   PRN Meds:.acetaminophen **OR** acetaminophen, hydrALAZINE, iohexol, ondansetron **OR** ondansetron (ZOFRAN) IV, sodium chloride flush   Assessment: Active Problems:   Severe anemia   Acute anemia   Constipation    Plan: We will proceed with upper endoscopy today for evaluation of acute iron deficiency anemia However, patient has not had any signs of active bleeding and her iron deficiency may be due to her Roux-en-Y gastric bypass status  Patient's alk phos has increased today Total bilirubin remain normal.  Right upper quadrant ultrasound done and does not report any CBD obstruction or dilation  Monitor liver enzymes at least once daily Avoid hepatotoxic drugs If liver enzymes remain elevated, obtain further work-up including hepatitis panel  I have discussed alternative options, risks & benefits,  which include, but are not limited to, bleeding, infection, perforation,respiratory complication & drug reaction.  The patient agrees with this plan & written consent  will be obtained.    Patient was advised to follow-up with hematology on discharge as well as she would likely benefit from IV iron replacement in the setting of iron deficiency anemia likely from her underlying Roux-en-Y gastric bypass, underlying constipation and unlikely to tolerate p.o. iron well due to this    LOS: 1 day   Melanie Antigua, MD 03/23/2020, 10:32  AM

## 2020-03-23 NOTE — Transfer of Care (Signed)
Immediate Anesthesia Transfer of Care Note  Patient: Melanie Olson  Procedure(s) Performed: ESOPHAGOGASTRODUODENOSCOPY (EGD) WITH PROPOFOL (N/A )  Patient Location: PACU  Anesthesia Type:General  Level of Consciousness: sedated  Airway & Oxygen Therapy: Patient Spontanous Breathing and Patient connected to nasal cannula oxygen  Post-op Assessment: Report given to RN and Post -op Vital signs reviewed and stable  Post vital signs: Reviewed and stable  Last Vitals:  Vitals Value Taken Time  BP 107/60 03/23/20 1426  Temp    Pulse 75 03/23/20 1426  Resp 24 03/23/20 1426  SpO2 93 % 03/23/20 1426    Last Pain:  Vitals:   03/23/20 1334  TempSrc: Temporal  PainSc: 3          Complications: No complications documented.

## 2020-03-23 NOTE — Anesthesia Preprocedure Evaluation (Signed)
Anesthesia Evaluation  Patient identified by MRN, date of birth, ID band Patient awake    Reviewed: Allergy & Precautions, H&P , NPO status , Patient's Chart, lab work & pertinent test results  Airway Mallampati: II  TM Distance: >3 FB Neck ROM: full    Dental  (+) Teeth Intact   Pulmonary neg pulmonary ROS, neg COPD, Not current smoker,    breath sounds clear to auscultation       Cardiovascular (-) angina(-) Past MI negative cardio ROS   Rhythm:regular Rate:Normal     Neuro/Psych  Headaches, PSYCHIATRIC DISORDERS Depression    GI/Hepatic Neg liver ROS, GERD  ,  Endo/Other  negative endocrine ROS  Renal/GU negative Renal ROS  negative genitourinary   Musculoskeletal   Abdominal   Peds  Hematology  (+) Blood dyscrasia, anemia ,   Anesthesia Other Findings Past Medical History: No date: Abdominal wall ulcer (HCC) No date: Depression No date: GERD (gastroesophageal reflux disease) No date: HSV (herpes simplex virus) infection No date: Migraine  Past Surgical History: No date: CHOLECYSTECTOMY 2006: GASTRIC BYPASS No date: tummy tuck  BMI    Body Mass Index: 33.29 kg/m      Reproductive/Obstetrics negative OB ROS                             Anesthesia Physical  Anesthesia Plan  ASA: II  Anesthesia Plan: General   Post-op Pain Management:    Induction: Intravenous  PONV Risk Score and Plan: 3 and Propofol infusion and TIVA  Airway Management Planned: Natural Airway and Nasal Cannula  Additional Equipment: None  Intra-op Plan:   Post-operative Plan:   Informed Consent: I have reviewed the patients History and Physical, chart, labs and discussed the procedure including the risks, benefits and alternatives for the proposed anesthesia with the patient or authorized representative who has indicated his/her understanding and acceptance.     Dental Advisory Given  Plan  Discussed with: Anesthesiologist, CRNA and Surgeon  Anesthesia Plan Comments: (Discussed risks of anesthesia with patient, including possibility of difficulty with spontaneous ventilation under anesthesia necessitating airway intervention, PONV, and rare risks such as cardiac or respiratory or neurological events. Patient understands.)        Anesthesia Quick Evaluation

## 2020-03-23 NOTE — Anesthesia Postprocedure Evaluation (Signed)
Anesthesia Post Note  Patient: Melanie Olson  Procedure(s) Performed: ESOPHAGOGASTRODUODENOSCOPY (EGD) WITH PROPOFOL (N/A )  Patient location during evaluation: Endoscopy Anesthesia Type: General Level of consciousness: awake Pain management: pain level controlled Vital Signs Assessment: post-procedure vital signs reviewed and stable Respiratory status: spontaneous breathing Cardiovascular status: blood pressure returned to baseline Postop Assessment: no apparent nausea or vomiting Anesthetic complications: no   No complications documented.   Last Vitals:  Vitals:   03/23/20 1436 03/23/20 1446  BP: 118/80 121/73  Pulse: 75 72  Resp: (!) 24 (!) 26  Temp:    SpO2: 100% 100%    Last Pain:  Vitals:   03/23/20 1446  TempSrc:   PainSc: 0-No pain                 Emilio Math

## 2020-03-23 NOTE — Op Note (Addendum)
[facility] Patient Name: Melanie Olson Procedure Date: 03/23/2020 2:09 PM MRN: [id number] Account #: [id number] Date of Birth: 26-Aug-1970 Admit Type: Inpatient Age: 50 Room: [facility] ENDO ROOM 3 Gender: Female Note Status: Finalized Procedure:             Upper GI endoscopy Indications:           Iron deficiency anemia Providers:             Kawon Willcutt B. Maximino Greenland MD, MD Referring MD:          Bonnita Nasuti j. [facility], dr (Referring MD) Medicines:             Monitored Anesthesia Care Complications:         No immediate complications. Procedure:             Pre-Anesthesia Assessment:                        - The risks and benefits of the procedure and the                         sedation options and risks were discussed with the                         patient. All questions were answered and informed                         consent was obtained.                        - Patient identification and proposed procedure were                         verified prior to the procedure.                        - ASA Grade Assessment: III - A patient with severe                         systemic disease.                        After obtaining informed consent, the endoscope was                         passed under direct vision. Throughout the procedure,                         the patient's blood pressure, pulse, and oxygen                         saturations were monitored continuously. The Endoscope                         was introduced through the mouth, and advanced to the                         jejunum. The upper GI endoscopy was accomplished with  ease. The patient tolerated the procedure well. Findings:      The examined esophagus was normal.      Evidence of a Roux-en-Y anastomosis was found in the stomach. This was       characterized by ulceration.      One gastric ulcer was found at the anastomosis. The lesion was 8 mm  in       largest dimension.      The exam of the stomach was otherwise normal.      The examined jejunum was normal. Impression:            - Normal esophagus.                        - A Roux-en-Y anastomosis was found, characterized by                         ulceration.                        - Gastric ulcer.                        - Normal examined jejunum.                        - No specimens collected. Recommendation:        - Use Protonix (pantoprazole) 40 mg IV BID.                        - Use sucralfate suspension 1 gram PO QID.                        - Perform an H. pylori serology.                        - Treat for constipation with bowel regimen such as                         miralax and titrate as necessary                        - Return patient to hospital ward for ongoing care.                        - Continue present medications.                        - The findings and recommendations were discussed with                         the patient.                        - Return to my office in 4 weeks.                        - Continue Serial CBCs and transfuse PRN Procedure Code(s):     --- Professional ---                        [phone], Esophagogastroduodenoscopy, flexible,  transoral; diagnostic, including collection of                         specimen(s) by brushing or washing, when performed                         (separate procedure) Diagnosis Code(s):     --- Professional ---                        U23.53, Bariatric surgery status                        K25.9, Gastric ulcer, unspecified as acute or chronic,                         without hemorrhage or perforation                        D50.9, Iron deficiency anemia, unspecified CPT copyright 2019 American Medical Association. All rights reserved. The codes documented in this report are preliminary and upon coder review may  be revised to meet current compliance requirements.  [name], MD Michel Bickers B. Maximino Greenland MD, MD 03/23/2020 2:31:40 PM This report has been signed electronically. Number of Addenda: 0 Note Initiated On: 03/23/2020 2:09 PM Estimated Blood Loss:  Estimated blood loss: none.      [facility]

## 2020-03-24 LAB — CBC WITH DIFFERENTIAL/PLATELET
Abs Immature Granulocytes: 0.04 10*3/uL (ref 0.00–0.07)
Basophils Absolute: 0 10*3/uL (ref 0.0–0.1)
Basophils Relative: 0 %
Eosinophils Absolute: 0.3 10*3/uL (ref 0.0–0.5)
Eosinophils Relative: 4 %
HCT: 30.8 % — ABNORMAL LOW (ref 36.0–46.0)
Hemoglobin: 9 g/dL — ABNORMAL LOW (ref 12.0–15.0)
Immature Granulocytes: 1 %
Lymphocytes Relative: 11 %
Lymphs Abs: 0.8 10*3/uL (ref 0.7–4.0)
MCH: 20.3 pg — ABNORMAL LOW (ref 26.0–34.0)
MCHC: 29.2 g/dL — ABNORMAL LOW (ref 30.0–36.0)
MCV: 69.4 fL — ABNORMAL LOW (ref 80.0–100.0)
Monocytes Absolute: 0.6 10*3/uL (ref 0.1–1.0)
Monocytes Relative: 8 %
Neutro Abs: 6 10*3/uL (ref 1.7–7.7)
Neutrophils Relative %: 76 %
Platelets: 370 10*3/uL (ref 150–400)
RBC: 4.44 MIL/uL (ref 3.87–5.11)
RDW: 23.6 % — ABNORMAL HIGH (ref 11.5–15.5)
Smear Review: NORMAL
WBC: 7.7 10*3/uL (ref 4.0–10.5)
nRBC: 0.4 % — ABNORMAL HIGH (ref 0.0–0.2)

## 2020-03-24 LAB — BASIC METABOLIC PANEL
Anion gap: 7 (ref 5–15)
BUN: <5 mg/dL — ABNORMAL LOW (ref 6–20)
CO2: 25 mmol/L (ref 22–32)
Calcium: 8.4 mg/dL — ABNORMAL LOW (ref 8.9–10.3)
Chloride: 105 mmol/L (ref 98–111)
Creatinine, Ser: 0.47 mg/dL (ref 0.44–1.00)
GFR, Estimated: >60 mL/min (ref >60)
Glucose, Bld: 94 mg/dL (ref 70–99)
Potassium: 3.3 mmol/L — ABNORMAL LOW (ref 3.5–5.1)
Sodium: 137 mmol/L (ref 135–145)

## 2020-03-24 LAB — H PYLORI, IGM, IGG, IGA AB
H Pylori IgG: 0.25 Index Value (ref 0.00–0.79)
H. Pylogi, Iga Abs: <9 units (ref 0.0–8.9)
H. Pylogi, Igm Abs: 11.7 units — ABNORMAL HIGH (ref 0.0–8.9)

## 2020-03-24 LAB — HAPTOGLOBIN: Haptoglobin: 83 mg/dL (ref 42–296)

## 2020-03-24 MED ORDER — SODIUM CHLORIDE 0.9 % IV SOLN
300.0000 mg | Freq: Once | INTRAVENOUS | Status: DC
  Filled 2020-03-24: qty 15

## 2020-03-24 MED ORDER — POLYSACCHARIDE IRON COMPLEX 150 MG PO CAPS
150.0000 mg | ORAL_CAPSULE | Freq: Every day | ORAL | Status: DC
  Administered 2020-03-24: 150 mg via ORAL
  Filled 2020-03-24 (×2): qty 1

## 2020-03-24 MED ORDER — LACTULOSE 10 GM/15ML PO SOLN: 20.0000 g | Freq: Two times a day (BID) | ORAL | 0 refills | Status: DC

## 2020-03-24 MED ORDER — POTASSIUM CHLORIDE 10 MEQ/100ML IV SOLN
10.0000 meq | INTRAVENOUS | Status: AC
  Administered 2020-03-24 (×2): 10 meq via INTRAVENOUS
  Filled 2020-03-24: qty 100

## 2020-03-24 MED ORDER — SODIUM CHLORIDE 0.9 % IV SOLN
300.0000 mg | Freq: Once | INTRAVENOUS | Status: AC
  Administered 2020-03-24: 300 mg via INTRAVENOUS
  Filled 2020-03-24: qty 15

## 2020-03-24 MED ORDER — POLYSACCHARIDE IRON COMPLEX 150 MG PO CAPS: 150.0000 mg | ORAL_CAPSULE | Freq: Every day | ORAL | 0 refills | Status: DC

## 2020-03-24 MED ORDER — POTASSIUM CHLORIDE 10 MEQ/100ML IV SOLN
10.0000 meq | INTRAVENOUS | Status: AC
Start: 2020-03-24 — End: 2020-03-24
  Administered 2020-03-24 (×2): 10 meq via INTRAVENOUS
  Filled 2020-03-24 (×3): qty 100

## 2020-03-24 MED ORDER — SUCRALFATE 1 GM/10ML PO SUSP: 1.0000 g | Freq: Four times a day (QID) | ORAL | 0 refills | Status: DC

## 2020-03-24 NOTE — Progress Notes (Signed)
To Whom It May Concern,  Ms. Melanie Olson is admitted to the hospital from 2/12-2/15/2022.

## 2020-03-24 NOTE — Clinical Social Work Note (Signed)
Patient has orders to discharge home today. Called patient in room and she confirmed she does not have insurance. Patient is being prescribed three new medications which [facility] said will cost $63.92. Patient is aware and says she can afford this. CSW signing off.  Charlynn Court, CSW [phone]

## 2020-03-24 NOTE — Progress Notes (Signed)
[name], MD [address], [place], [place], [address] [address], [place], [place], [address] Phone: [phone]  Fax: [phone]   Subjective: Pt reports a BM today after receiving miralax and lactulose. Denies, abdominal pain, melena, hematochezia   Objective: Exam: Vital signs in last 24 hours: Vitals:   03/23/20 1809 03/23/20 1939 03/23/20 2352 03/24/20 0424  BP: (!) 141/88 120/66 123/75 123/71  Pulse: 89 92 78 75  Resp: 20 20 20 20   Temp: 99.2 F (37.3 C) 99.2 F (37.3 C) 99 F (37.2 C) 98.6 F (37 C)  TempSrc: Oral Oral Oral Oral  SpO2: 98% 99% 100% 100%  Weight:      Height:       Weight change:   Intake/Output Summary (Last 24 hours) at 03/24/2020 03/26/2020 Last data filed at 03/24/2020 0531 Gross per 24 hour  Intake 540 ml  Output 0 ml  Net 540 ml    General: No acute distress, AAO x3 Abd: Soft, NT/ND, No HSM Skin: Warm, no rashes Neck: Supple, Trachea midline   Lab Results: Lab Results  Component Value Date   WBC 7.7 03/24/2020   HGB 9.0 (L) 03/24/2020   HCT 30.8 (L) 03/24/2020   MCV 69.4 (L) 03/24/2020   PLT 370 03/24/2020   Micro Results: Recent Results (from the past 240 hour(s))  SARS CORONAVIRUS 2 (TAT 6-24 HRS) Nasopharyngeal Nasopharyngeal Swab     Status: None   Collection Time: 03/21/20  2:53 PM   Specimen: Nasopharyngeal Swab  Result Value Ref Range Status   SARS Coronavirus 2 NEGATIVE NEGATIVE Final    Comment: (NOTE) SARS-CoV-2 target nucleic acids are NOT DETECTED.  The SARS-CoV-2 RNA is generally detectable in upper and lower respiratory specimens during the acute phase of infection. Negative results do not preclude SARS-CoV-2 infection, do not rule out co-infections with other pathogens, and should not be used as the sole basis for treatment or other patient management decisions. Negative results must be combined with clinical observations, patient history, and epidemiological information.  The expected result is Negative.  Fact Sheet for Patients: 05/19/20  Fact Sheet for Healthcare Providers: HairSlick.no  This test is not yet approved or cleared by the [employer]ir[employer] FDA and  has been authorized for detection and/or diagnosis of SARS-CoV-2 by FDA under an Emergency Use Authorization (EUA). This EUA will remain  in effect (meaning this test can be used) for the duration of the COVID-19 declaration under Se ction 564(b)(1) of the Act, 21 U.S.C. section 360bbb-3(b)(1), unless the authorization is terminated or revoked sooner.  Performed at [facility], [address] [place], [place]    Studies/Results: [address]35 Abdomen Limited RUQ (LIVER/GB)  Result Date: 03/23/2020 CLINICAL DATA:  Hepatic cirrhosis. EXAM: ULTRASOUND ABDOMEN LIMITED RIGHT UPPER QUADRANT COMPARISON:  CT abdomen and pelvis March 21, 2020 FINDINGS: Gallbladder: Surgically absent Common bile duct: Diameter: 7 mm Liver: No focal lesion identified. Diffusely increased parenchymal echogenicity. Portal vein is patent on color Doppler imaging with normal direction of blood flow towards the liver. Other: None. IMPRESSION: 1. The echogenicity of the liver is increased. This is a nonspecific finding but is most commonly seen with fatty infiltration of the liver. There are no obvious focal liver lesions. 2. Prior cholecystectomy. Electronically Signed   By: March 23, 2020 MD   On: 03/23/2020 12:31   Medications:  Scheduled Meds: . amitriptyline  25 mg Oral QHS  . iron polysaccharides  150 mg Oral Daily  .  lactulose  20 g Oral BID  . pantoprazole (PROTONIX) IV  40 mg Intravenous BID  . polyethylene glycol  17 g Oral BID  . sucralfate  1 g Oral QID   Continuous Infusions: . sodium chloride    . iron sucrose    . 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  Above plan discussed with pt and she is agreeable and verbalized understanding   LOS: 2 days   [name], MD 03/24/2020, 9:29 AM

## 2020-03-24 NOTE — Discharge Summary (Signed)
Physician Discharge Summary  Patient ID: Melanie Olson MRN: [id number] DOB/AGE: 50-Dec-1972 47 y.o.  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 She did not notice any black stool or bleeding. No nausea vomiting. Her initial hemoglobin was6.7, she received2unitsPRBC.  #1.  Acute blood loss anemia.  Secondary to gastric ulcer. Gastric ulcer at surgical anastomosis site. Iron deficient anemia. Patient will be giving iron infusion.  EGD showed gastric ulcer at the surgical anastomosis site. She received a total of 2 doses of IV iron.  Concerning for absorption.  Patient be referred to hematology as outpatient.  Hemoglobin much better today.  2.  Severe constipation. Condition resolved after giving lactulose.  Continue as needed lactulose at home,  3.  Hypokalemia. Repleted with IV again.  #4.  Nonalcoholic steatohepatitis with liver function changes. Right upper quadrant ultrasound showed fatty liver.    Consults: GI  Significant Diagnostic Studies:  EGD: - Normal esophagus. - A Roux-en-Y anastomosis was found, characterized by ulceration. - Gastric ulcer. - Normal examined jejunum. - No specimens collected.  CT ABDOMEN AND PELVIS WITH CONTRAST  TECHNIQUE: Multidetector CT imaging of the abdomen and pelvis was performed using the standard protocol following bolus administration  of intravenous contrast. Oral contrast was also administered.  CONTRAST:  OMNIPAQUE IOHEXOL 300 MG/ML  SOLN  COMPARISON:  June 01, 2019  FINDINGS: Lower chest: Lung bases are clear. There is a stable right epicardial cyst measuring 2.6 x 1.3 cm.  Hepatobiliary: There is hepatic steatosis. Beyond hepatic steatosis, no focal liver lesions are appreciable. Gallbladder is absent. The common hepatic duct remains prominent at 10 mm, stable. No biliary duct mass or calculus evident by CT.  Pancreas: No pancreatic mass or inflammatory focus evident.  Spleen: No splenic lesions are evident.  Adrenals/Urinary Tract: Adrenals bilaterally appear normal. There is a cyst measuring 8 mm in the lateral mid right kidney. There are parapelvic cysts on the right, largest measuring 2.5 x 1.5 cm. There is a 1 x 1 cm parapelvic cyst on the left. There is a cyst medial to the renal pelvis in the mid left kidney measuring 7 x 7 mm. There is an extrarenal pelvis on each side, an anatomic variant. There is no hydronephrosis on either side. There is no evident renal or ureteral calculus on either side. Urinary bladder is midline with wall thickness within normal limits.  Stomach/Bowel: Patient is status post gastric bypass procedure. No wall thickening or fluid in perioperative regions noted. There is diffuse stool throughout much of the colon. There is no appreciable diverticular disease. No appreciable bowel wall or mesenteric thickening evident. There is no appreciable bowel obstruction. The terminal ileum appears normal. There is no appreciable free air or portal venous air.  Vascular/Lymphatic: There is no abdominal aortic aneurysm. No arterial vascular lesions are evident. Major venous structures appear patent. There is no evident adenopathy in the abdomen or pelvis. Scattered subcentimeter mesenteric lymph nodes are considered nonspecific.  Reproductive: Uterus  is anteverted.  There is a decreased attenuation mass in the volar aspect of the uterus measuring 3.6 x 3.6 cm, an apparent leiomyoma. This apparent leiomyoma displaces the endometrium slightly dorsally. No adnexal masses identified beyond physiologic follicles. Intrauterine device no longer present.  Other: There is no periappendiceal region inflammatory change. No abscess or ascites is evident in the abdomen or pelvis.  Musculoskeletal: No blastic or lytic bone lesions. No abdominal wall or intramuscular lesions are evident.  IMPRESSION: 1. Fairly diffuse stool throughout much of the colon. No bowel wall thickening or bowel obstruction. Status post gastric bypass procedure without complicating features evident.  2. Hepatic steatosis. Gallbladder absent. Borderline common hepatic and proximal common bile duct dilatation without mass or calculus evident by CT.  3. Leiomyoma within the mid uterus which causes mild impression upon the endometrium. Intrauterine device no longer present.  4. No renal or ureteral calculus. No hydronephrosis. Urinary bladder wall thickness normal.  5.  Small stable epicardial cyst on the right.   Electronically Signed   By: Bretta Bang III M.D.   On: 03/21/2020 16:18  ULTRASOUND ABDOMEN LIMITED RIGHT UPPER QUADRANT  COMPARISON:  CT abdomen and pelvis March 21, 2020  FINDINGS: Gallbladder:  Surgically absent  Common bile duct:  Diameter: 7 mm  Liver:  No focal lesion identified. Diffusely increased parenchymal echogenicity. Portal vein is patent on color Doppler imaging with normal direction of blood flow towards the liver.  Other: None.  IMPRESSION: 1. The echogenicity of the liver is increased. This is a nonspecific finding but is most commonly seen with fatty infiltration of the liver. There are no obvious focal liver lesions. 2. Prior cholecystectomy.   Electronically Signed   By: Maudry Mayhew MD   On:  03/23/2020 12:31    Treatments: Protonix iv  Discharge Exam: Blood pressure 123/71, pulse 75, temperature 98.6 F (37 C), temperature source Oral, resp. rate 20, height 5\' 2"  (1.575 m), weight 78.5 kg, SpO2 100 %. General appearance: alert and cooperative Resp: clear to auscultation bilaterally Cardio: regular rate and rhythm, S1, S2 normal, no murmur, click, rub or gallop GI: soft, non-tender; bowel sounds normal; no masses,  no organomegaly Extremities: extremities normal, atraumatic, no cyanosis or edema  Disposition: Discharge disposition: 01-Home or Self Care       Discharge Instructions    Diet - low sodium heart healthy   Complete by: As directed    Increase activity slowly   Complete by: As directed      Allergies as of 03/24/2020      Reactions   Penicillins Rash      Medication List    STOP taking these medications   EQL Slow Release Iron 160 (50 Fe) MG Tbcr SR tablet Generic drug: ferrous sulfate     TAKE these medications   acetaminophen 650 MG CR tablet Commonly known as: TYLENOL Take 650 mg by mouth every 8 (eight) hours as needed for pain.   acyclovir 200 MG capsule Commonly known as: ZOVIRAX Take 2 capsules by mouth in the morning and at bedtime.   amitriptyline 25 MG tablet Commonly known as: ELAVIL Take 25 mg by mouth at bedtime.   cyclobenzaprine 10 MG tablet Commonly known as: FLEXERIL Take 10 mg by mouth 3 (three) times daily as needed for muscle spasms.   gabapentin 300 MG capsule Commonly known as: NEURONTIN Take 1 capsule by mouth 2 (two) times daily.   iron polysaccharides 150 MG capsule Commonly known as: NIFEREX Take  1 capsule (150 mg total) by mouth daily.   lactulose 10 GM/15ML solution Commonly known as: CHRONULAC Take 30 mLs (20 g total) by mouth 2 (two) times daily.   pantoprazole 40 MG tablet Commonly known as: Protonix Take 1 tablet (40 mg total) by mouth 2 (two) times daily.   sucralfate 1 GM/10ML  suspension Commonly known as: CARAFATE Take 10 mLs (1 g total) by mouth 4 (four) times daily for 14 days.       Follow-up Information    [facility] Follow up in 1 week(s).   Specialty: General Practice Contact information: [address] [place] [address] [phone]        Rickard Patience, MD Follow up in 2 week(s).   Specialty: Oncology Contact information: [address] [place] [address] [phone]        Midge Minium, MD Follow up in 4 week(s).   Specialty: Gastroenterology Contact information: [address] [place]  [place] [address] [phone]               Signed: Marrion Coy 03/24/2020, 9:15 AM

## 2020-03-27 ENCOUNTER — Encounter: Payer: Self-pay | Admitting: Emergency Medicine

## 2020-03-27 ENCOUNTER — Inpatient Hospital Stay
Admission: EM | Admit: 2020-03-27 | Discharge: 2020-03-28 | DRG: 392 | Disposition: A | Discharge status: Other Acute Inpatient Hospital | Payer: Self-pay | Attending: Family Medicine | Admitting: Family Medicine

## 2020-03-27 ENCOUNTER — Other Ambulatory Visit: Payer: Self-pay

## 2020-03-27 ENCOUNTER — Emergency Department: Payer: Self-pay

## 2020-03-27 DIAGNOSIS — Z9884 Bariatric surgery status: Secondary | ICD-10-CM

## 2020-03-27 DIAGNOSIS — Z79899 Other long term (current) drug therapy: Secondary | ICD-10-CM

## 2020-03-27 DIAGNOSIS — R1013 Epigastric pain: Principal | ICD-10-CM | POA: Diagnosis present

## 2020-03-27 DIAGNOSIS — K275 Chronic or unspecified peptic ulcer, site unspecified, with perforation: Secondary | ICD-10-CM | POA: Diagnosis present

## 2020-03-27 DIAGNOSIS — K219 Gastro-esophageal reflux disease without esophagitis: Secondary | ICD-10-CM | POA: Diagnosis present

## 2020-03-27 DIAGNOSIS — K282 Acute gastrojejunal ulcer with both hemorrhage and perforation: Secondary | ICD-10-CM

## 2020-03-27 DIAGNOSIS — Z20822 Contact with and (suspected) exposure to covid-19: Secondary | ICD-10-CM | POA: Diagnosis present

## 2020-03-27 LAB — URINALYSIS, COMPLETE (UACMP) WITH MICROSCOPIC
Bilirubin Urine: NEGATIVE
Glucose, UA: NEGATIVE mg/dL
Hgb urine dipstick: NEGATIVE
Ketones, ur: 5 mg/dL — AB
Leukocytes,Ua: NEGATIVE
Nitrite: NEGATIVE
Protein, ur: NEGATIVE mg/dL
Specific Gravity, Urine: 1.02 (ref 1.005–1.030)
pH: 5 (ref 5.0–8.0)

## 2020-03-27 LAB — COMPREHENSIVE METABOLIC PANEL
ALT: 30 U/L (ref 0–44)
AST: 19 U/L (ref 15–41)
Albumin: 3.9 g/dL (ref 3.5–5.0)
Alkaline Phosphatase: 139 U/L — ABNORMAL HIGH (ref 38–126)
Anion gap: 10 (ref 5–15)
BUN: 9 mg/dL (ref 6–20)
CO2: 25 mmol/L (ref 22–32)
Calcium: 9 mg/dL (ref 8.9–10.3)
Chloride: 102 mmol/L (ref 98–111)
Creatinine, Ser: 0.57 mg/dL (ref 0.44–1.00)
GFR, Estimated: >60 mL/min (ref >60)
Glucose, Bld: 101 mg/dL — ABNORMAL HIGH (ref 70–99)
Potassium: 3.5 mmol/L (ref 3.5–5.1)
Sodium: 137 mmol/L (ref 135–145)
Total Bilirubin: 0.9 mg/dL (ref 0.3–1.2)
Total Protein: 7.8 g/dL (ref 6.5–8.1)

## 2020-03-27 LAB — CBC
HCT: 37.2 % (ref 36.0–46.0)
Hemoglobin: 10.8 g/dL — ABNORMAL LOW (ref 12.0–15.0)
MCH: 21.1 pg — ABNORMAL LOW (ref 26.0–34.0)
MCHC: 29 g/dL — ABNORMAL LOW (ref 30.0–36.0)
MCV: 72.8 fL — ABNORMAL LOW (ref 80.0–100.0)
Platelets: 627 10*3/uL — ABNORMAL HIGH (ref 150–400)
RBC: 5.11 MIL/uL (ref 3.87–5.11)
RDW: 27.1 % — ABNORMAL HIGH (ref 11.5–15.5)
WBC: 7 10*3/uL (ref 4.0–10.5)
nRBC: 0 % (ref 0.0–0.2)

## 2020-03-27 LAB — POC URINE PREG, ED: Preg Test, Ur: NEGATIVE

## 2020-03-27 LAB — LIPASE, BLOOD: Lipase: 26 U/L (ref 11–51)

## 2020-03-27 LAB — TYPE AND SCREEN
ABO/RH(D): A POS
Antibody Screen: NEGATIVE

## 2020-03-27 LAB — RESP PANEL BY RT-PCR (FLU A&B, COVID) ARPGX2
Influenza A by PCR: NEGATIVE
Influenza B by PCR: NEGATIVE
SARS Coronavirus 2 by RT PCR: NEGATIVE

## 2020-03-27 LAB — LACTIC ACID, PLASMA: Lactic Acid, Venous: 1.1 mmol/L (ref 0.5–1.9)

## 2020-03-27 MED ORDER — IOHEXOL 300 MG/ML  SOLN
100.0000 mL | Freq: Once | INTRAMUSCULAR | Status: AC | PRN
  Administered 2020-03-27: 100 mL via INTRAVENOUS

## 2020-03-27 MED ORDER — LIDOCAINE VISCOUS HCL 2 % MT SOLN
15.0000 mL | Freq: Once | OROMUCOSAL | Status: AC
  Administered 2020-03-27: 15 mL via ORAL
  Filled 2020-03-27: qty 15

## 2020-03-27 MED ORDER — PIPERACILLIN-TAZOBACTAM 3.375 G IVPB 30 MIN
3.3750 g | Freq: Once | INTRAVENOUS | Status: AC
  Administered 2020-03-27: 3.375 g via INTRAVENOUS
  Filled 2020-03-27: qty 50

## 2020-03-27 MED ORDER — ALUM & MAG HYDROXIDE-SIMETH 200-200-20 MG/5ML PO SUSP
30.0000 mL | Freq: Once | ORAL | Status: AC
  Administered 2020-03-27: 30 mL via ORAL
  Filled 2020-03-27: qty 30

## 2020-03-27 NOTE — ED Provider Notes (Signed)
[facility] Emergency Department Provider Note   ____________________________________________   Event Date/Time   First MD Initiated Contact with Patient 03/27/20 1918     (approximate)  I have reviewed the triage vital signs and the nursing notes.   HISTORY  Chief Complaint Abdominal Pain    HPI Melanie Olson is a 50 y.o. 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 Patient describes a sharp/burning, 9/10, constant abdominal pain that is worsened with p.o. intake. Patient denies any other exacerbating or relieving factors for this pain. Patient currently denies any vision changes, tinnitus, difficulty speaking, facial droop, sore throat, chest pain, shortness of breath, diarrhea, dysuria, or weakness/numbness/paresthesias in any extremity         Past Medical History:  Diagnosis Date  . Abdominal wall ulcer (HCC)   . Depression   . GERD (gastroesophageal reflux disease)   . HSV (herpes simplex virus) infection   . Migraine     Patient Active Problem List   Diagnosis Date Noted  . Ulcer at site of surgical anastomosis following bypass of stomach   . Acute anemia 03/22/2020  . Constipation 03/22/2020  . Severe anemia 03/21/2020    Past Surgical History:  Procedure Laterality Date  . CHOLECYSTECTOMY    . COLONOSCOPY WITH PROPOFOL N/A 07/11/2019   Procedure: COLONOSCOPY WITH PROPOFOL;  Surgeon: Pasty Spillers, MD;  Location: [facility] ENDOSCOPY;  Service: Endoscopy;  Laterality: N/A;  . ESOPHAGOGASTRODUODENOSCOPY (EGD) WITH PROPOFOL N/A 07/11/2019   Procedure: ESOPHAGOGASTRODUODENOSCOPY (EGD) WITH PROPOFOL;  Surgeon: Pasty Spillers, MD;  Location: [facility] ENDOSCOPY;  Service: Endoscopy;  Laterality: N/A;  . ESOPHAGOGASTRODUODENOSCOPY (EGD)  WITH PROPOFOL N/A 03/23/2020   Procedure: ESOPHAGOGASTRODUODENOSCOPY (EGD) WITH PROPOFOL;  Surgeon: Pasty Spillers, MD;  Location: [facility] ENDOSCOPY;  Service: Endoscopy;  Laterality: N/A;  . GASTRIC BYPASS  2006  . tummy tuck      Prior to Admission medications   Medication Sig Start Date End Date Taking? Authorizing Provider  acetaminophen (TYLENOL) 650 MG CR tablet Take 650 mg by mouth every 8 (eight) hours as needed for pain.    [provider]  acyclovir (ZOVIRAX) 200 MG capsule Take 2 capsules by mouth in the morning and at bedtime.    [provider]  amitriptyline (ELAVIL) 25 MG tablet Take 25 mg by mouth at bedtime.    [provider]  cyclobenzaprine (FLEXERIL) 10 MG tablet Take 10 mg by mouth 3 (three) times daily as needed for muscle spasms.    [provider]  gabapentin (NEURONTIN) 300 MG capsule Take 1 capsule by mouth 2 (two) times daily. Patient not taking: Reported on 03/21/2020 06/30/18   [provider]  iron polysaccharides (NIFEREX) 150 MG capsule Take 1 capsule (150 mg total) by mouth daily. 03/24/20   Marrion Coy, MD  lactulose (CHRONULAC) 10 GM/15ML solution Take 30 mLs (20 g total) by mouth 2 (two) times daily. 03/24/20   Marrion Coy, MD  pantoprazole (PROTONIX) 40 MG tablet Take 1 tablet (40 mg total) by mouth 2 (two) times daily. 06/01/19   Emily Filbert, MD  sucralfate (CARAFATE) 1 GM/10ML suspension Take 10 mLs (1 g total) by mouth 4 (four) times daily for 14 days. 03/24/20 04/07/20  Marrion Coy, MD    Allergies Penicillins  No family history on  file.  Social History Social History   Tobacco Use  . Smoking status: Never Smoker  . Smokeless tobacco: Never Used  Vaping Use  . Vaping Use: Never used  Substance Use Topics  . Alcohol use: No  . Drug use: No    Review of Systems Constitutional: No fever/chills Eyes: No visual changes. ENT: No sore throat. Cardiovascular: Denies chest pain. Respiratory:  Denies shortness of breath. Gastrointestinal: Endorses abdominal pain, nausea, and vomiting.  No diarrhea. Genitourinary: Negative for dysuria. Musculoskeletal: Negative for acute arthralgias Skin: Negative for rash. Neurological: Negative for headaches, weakness/numbness/paresthesias in any extremity Psychiatric: Negative for suicidal ideation/homicidal ideation   ____________________________________________   PHYSICAL EXAM:  VITAL SIGNS: ED Triage Vitals  Enc Vitals Group     BP 03/27/20 1723 129/74     Pulse Rate 03/27/20 1723 80     Resp 03/27/20 1723 17     Temp 03/27/20 1723 98.7 F (37.1 C)     Temp Source 03/27/20 1723 Oral     SpO2 03/27/20 1726 100 %     Weight 03/27/20 1725 173 lb (78.5 kg)     Height 03/27/20 1725 5\' 2"  (1.575 m)     Head Circumference --      Peak Flow --      Pain Score 03/27/20 1725 9     Pain Loc --      Pain Edu? --      Excl. in GC? --    Constitutional: Alert and oriented. Well appearing and in no acute distress. Eyes: Conjunctivae are normal. PERRL. Head: Atraumatic. Nose: No congestion/rhinnorhea. Mouth/Throat: Mucous membranes are moist. Neck: No stridor Cardiovascular: Grossly normal heart sounds.  Good peripheral circulation. Respiratory: Normal respiratory effort.  No retractions. Gastrointestinal: Soft and tender to palpation in the right upper quadrant and midepigastric region. No distention. Musculoskeletal: No obvious deformities Neurologic:  Normal speech and language. No gross focal neurologic deficits are appreciated. Skin:  Skin is warm and dry. No rash noted. Psychiatric: Mood and affect are normal. Speech and behavior are normal.  ____________________________________________   LABS (all labs ordered are listed, but only abnormal results are displayed)  Labs Reviewed  COMPREHENSIVE METABOLIC PANEL - Abnormal; Notable for the following components:      Result Value   Glucose, Bld 101 (*)    Alkaline Phosphatase  139 (*)    All other components within normal limits  CBC - Abnormal; Notable for the following components:   Hemoglobin 10.8 (*)    MCV 72.8 (*)    MCH 21.1 (*)    MCHC 29.0 (*)    RDW 27.1 (*)    Platelets 627 (*)    All other components within normal limits  URINALYSIS, COMPLETE (UACMP) WITH MICROSCOPIC - Abnormal; Notable for the following components:   Color, Urine YELLOW (*)    APPearance HAZY (*)    Ketones, ur 5 (*)    Bacteria, UA MANY (*)    All other components within normal limits  RESP PANEL BY RT-PCR (FLU A&B, COVID) ARPGX2  LIPASE, BLOOD  LACTIC ACID, PLASMA  LACTIC ACID, PLASMA  POC URINE PREG, ED  POC OCCULT BLOOD, ED  TYPE AND SCREEN   RADIOLOGY  ED MD interpretation: CT of the abdomen and pelvis with IV contrast shows a contained perforated ulcer near the gastrojejunal anastomosis in the upper abdomen  Official radiology report(s): CT Abdomen Pelvis W Contrast  Result Date: 03/27/2020 CLINICAL DATA:  Acute, nonlocalized abdominal pain, recent discharge on  Tuesday, ulcer at the anastomosis from gastric bypass, anemia requiring transfusion EXAM: CT ABDOMEN AND PELVIS WITH CONTRAST TECHNIQUE: Multidetector CT imaging of the abdomen and pelvis was performed using the standard protocol following bolus administration of intravenous contrast. CONTRAST:  OMNIPAQUE IOHEXOL 300 MG/ML  SOLN COMPARISON:  CT 03/21/2020 FINDINGS: Lower chest: Lung bases are clear. Normal heart size. No pericardial effusion. Hepatobiliary: Diffuse hepatic hypoattenuation compatible with hepatic steatosis. Some more focal infiltration also noted near the falciform ligament. Smooth liver surface contour. No concerning focal liver lesion. Prior cholecystectomy. Slight prominence of the biliary tree is similar to prior. No visible calcified gallstone. Pancreas: No pancreatic ductal dilatation or surrounding inflammatory changes. Spleen: Normal in size. No concerning splenic lesions.  Adrenals/Urinary Tract: Normal adrenal glands. Kidneys are normally located with symmetric enhancementand excretion with bilateral extrarenal pelves. Small subcentimeter hypoattenuating focus in the right kidney (7/10) too small to fully characterize on CT imaging but statistically likely benign. Bilateral parapelvic cysts are unchanged prior. No suspicious renal lesion, urolithiasis or hydronephrosis. Urinary bladder is largely decompressed at the time of exam and therefore poorly evaluated by CT imaging. 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 There is some increasing stranding noted adjacent the anastomotic line with what appears to be a contained, perforated ulcer (2/22, 5/35). No frank spillage succus nor free air with peritoneum. Distal anastomosis appears patent. No obstructive changes are evident. No other small bowel thickening or dilatation. No colonic dilatation or wall thickening. A normal appendix is visualized. Vascular/Lymphatic: No significant vascular findings are present. No enlarged abdominal or pelvic lymph nodes. Reproductive: Anterior left fundal fibroid, similar to prior with slight distortion of the fundal endometrium. Simple appearing fluid attenuation cysts in the right adnexa. No concerning adnexal lesions. Other: Stranding and inflammation centered upon the contained perforation of a marginal ulcer near the gastrojejunal anastomosis in the upper abdomen. No frank spillage of succus or free air. Musculoskeletal: Multilevel degenerative changes are present in the imaged portions of the spine. No acute osseous abnormality or suspicious osseous lesion. IMPRESSION: 1. 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  Increasing conspicuity from the recent comparison. No frank spillage of succus or free air with peritoneum. 2. Hepatic steatosis. 3. Fibroid uterus. These results were called by telephone at the time of interpretation on 03/27/2020 at 8:41 pm to provider [facility] , who verbally acknowledged these results. Electronically Signed   By: Kreg Shropshire M.D.   On: 03/27/2020 20:42    ____________________________________________   PROCEDURES  Procedure(s) performed (including Critical Care):  .1-3 Lead EKG Interpretation Performed by: Merwyn Katos, MD Authorized by: Merwyn Katos, MD     Interpretation: normal     ECG rate:  72   ECG rate assessment: normal     Rhythm: sinus rhythm     Ectopy: none     Conduction: normal       ____________________________________________   INITIAL IMPRESSION / ASSESSMENT AND PLAN / ED COURSE  As part of my medical decision making, I reviewed the following data within the electronic MEDICAL RECORD NUMBER Nursing notes reviewed and incorporated, Labs reviewed, Old chart reviewed, Radiograph reviewed and Notes from prior ED visits reviewed and incorporated       Patient is a 50 year old female with a past  medical history of Roux-en-Y gastric bypass surgery 5 years prior to arrival as well as recent diagnosis of ulcer at this anastomotic site who presents for worsening of her recent epigastric and right upper quadrant abdominal pain Differential diagnosis: Gastritis, perforated ulcer, pancreatitis, colitis CT of the abdomen and pelvis shows concern for contained perforated ulcer at the anastomotic site Consult: [facility] Dr. Bedelia PersonLovick, who agrees to accept this patient in transfer but requested that she be placed on the hospitalist service  [facility]      ____________________________________________   FINAL CLINICAL IMPRESSION(S) / ED DIAGNOSES  Final diagnoses:  Epigastric pain  Acute perforated  gastrojejunal ulcer with hemorrhage [facility](HCC)     ED Discharge Orders    None       Note:  This document was prepared using Dragon voice recognition software and may include unintentional dictation errors.   Merwyn KatosBradler, Zamya Culhane K, MD 03/27/20 2325

## 2020-03-27 NOTE — ED Triage Notes (Signed)
Pt to ED via POV stating that she was D/C from the hospital on Tuesday. Pt reports that while here she had to get several blood transfusions. Pt reports that she is still having abdominal pain. Per DC notes pt was diagnosed with ulcer at surgical anastomosis site from gastric bypass, anemia, and constipation. Pt reports that her pain started back on Wednesday. Pt states that if she eats or drinks anything her pain gets worse and she is starting to dry heave. Pt is in NAD.

## 2020-03-27 NOTE — ED Notes (Signed)
Pt in CT at this time.

## 2020-03-27 NOTE — ED Notes (Signed)
Pt c/o nausea since yesterday with dry heaves today. Pt reports nausea increases after attempting fluids and sts she has had poor intake today. Pt reports recent history of gastric ulcer with GI bleeding. Denies melena or dark stools.

## 2020-03-28 ENCOUNTER — Encounter (HOSPITAL_COMMUNITY): Payer: Self-pay | Admitting: Internal Medicine

## 2020-03-28 ENCOUNTER — Inpatient Hospital Stay (HOSPITAL_COMMUNITY)
Admission: AD | Admit: 2020-03-28 | Discharge: 2020-04-01 | DRG: 382 | Disposition: A | Discharge status: Home / Self Care | Payer: Self-pay | Source: Other Acute Inpatient Hospital | Attending: Internal Medicine | Admitting: Internal Medicine

## 2020-03-28 DIAGNOSIS — K285 Chronic or unspecified gastrojejunal ulcer with perforation: Principal | ICD-10-CM | POA: Diagnosis present

## 2020-03-28 DIAGNOSIS — Z20822 Contact with and (suspected) exposure to covid-19: Secondary | ICD-10-CM | POA: Diagnosis present

## 2020-03-28 DIAGNOSIS — G43909 Migraine, unspecified, not intractable, without status migrainosus: Secondary | ICD-10-CM | POA: Diagnosis present

## 2020-03-28 DIAGNOSIS — R509 Fever, unspecified: Secondary | ICD-10-CM | POA: Diagnosis not present

## 2020-03-28 DIAGNOSIS — D649 Anemia, unspecified: Secondary | ICD-10-CM

## 2020-03-28 DIAGNOSIS — D500 Iron deficiency anemia secondary to blood loss (chronic): Secondary | ICD-10-CM | POA: Diagnosis present

## 2020-03-28 DIAGNOSIS — Z9049 Acquired absence of other specified parts of digestive tract: Secondary | ICD-10-CM

## 2020-03-28 DIAGNOSIS — K289 Gastrojejunal ulcer, unspecified as acute or chronic, without hemorrhage or perforation: Secondary | ICD-10-CM

## 2020-03-28 DIAGNOSIS — Z880 Allergy status to penicillin: Secondary | ICD-10-CM

## 2020-03-28 DIAGNOSIS — E876 Hypokalemia: Secondary | ICD-10-CM | POA: Diagnosis not present

## 2020-03-28 DIAGNOSIS — K219 Gastro-esophageal reflux disease without esophagitis: Secondary | ICD-10-CM | POA: Diagnosis present

## 2020-03-28 DIAGNOSIS — B009 Herpesviral infection, unspecified: Secondary | ICD-10-CM | POA: Diagnosis present

## 2020-03-28 DIAGNOSIS — F32A Depression, unspecified: Secondary | ICD-10-CM | POA: Diagnosis present

## 2020-03-28 DIAGNOSIS — Z79899 Other long term (current) drug therapy: Secondary | ICD-10-CM

## 2020-03-28 DIAGNOSIS — K5900 Constipation, unspecified: Secondary | ICD-10-CM | POA: Diagnosis present

## 2020-03-28 DIAGNOSIS — K275 Chronic or unspecified peptic ulcer, site unspecified, with perforation: Secondary | ICD-10-CM | POA: Diagnosis present

## 2020-03-28 DIAGNOSIS — K7581 Nonalcoholic steatohepatitis (NASH): Secondary | ICD-10-CM | POA: Diagnosis present

## 2020-03-28 DIAGNOSIS — T85898A Other specified complication of other internal prosthetic devices, implants and grafts, initial encounter: Secondary | ICD-10-CM

## 2020-03-28 DIAGNOSIS — Z9884 Bariatric surgery status: Secondary | ICD-10-CM

## 2020-03-28 LAB — CBC
HCT: 34.9 % — ABNORMAL LOW (ref 36.0–46.0)
Hemoglobin: 10.3 g/dL — ABNORMAL LOW (ref 12.0–15.0)
MCH: 21.7 pg — ABNORMAL LOW (ref 26.0–34.0)
MCHC: 29.5 g/dL — ABNORMAL LOW (ref 30.0–36.0)
MCV: 73.6 fL — ABNORMAL LOW (ref 80.0–100.0)
Platelets: 474 10*3/uL — ABNORMAL HIGH (ref 150–400)
RBC: 4.74 MIL/uL (ref 3.87–5.11)
RDW: 27.3 % — ABNORMAL HIGH (ref 11.5–15.5)
WBC: 12.2 10*3/uL — ABNORMAL HIGH (ref 4.0–10.5)
nRBC: 0 % (ref 0.0–0.2)

## 2020-03-28 MED ORDER — LACTATED RINGERS IV SOLN: INTRAVENOUS | Status: DC

## 2020-03-28 MED ORDER — SODIUM CHLORIDE 0.9 % IV SOLN
2.0000 g | INTRAVENOUS | Status: DC
  Administered 2020-03-29 – 2020-03-31 (×4): 2 g via INTRAVENOUS
  Filled 2020-03-28 (×4): qty 20

## 2020-03-28 MED ORDER — FLUCONAZOLE IN SODIUM CHLORIDE 400-0.9 MG/200ML-% IV SOLN
400.0000 mg | INTRAVENOUS | Status: DC
  Administered 2020-03-29 – 2020-03-31 (×4): 400 mg via INTRAVENOUS
  Filled 2020-03-28 (×5): qty 200

## 2020-03-28 MED ORDER — MORPHINE SULFATE (PF) 4 MG/ML IV SOLN
4.0000 mg | INTRAVENOUS | Status: DC | PRN
  Administered 2020-03-28 – 2020-03-30 (×6): 4 mg via INTRAVENOUS
  Filled 2020-03-28 (×6): qty 1

## 2020-03-28 MED ORDER — ACETAMINOPHEN 325 MG PO TABS
650.0000 mg | ORAL_TABLET | Freq: Four times a day (QID) | ORAL | Status: DC | PRN
  Filled 2020-03-28: qty 2

## 2020-03-28 MED ORDER — MORPHINE SULFATE (PF) 4 MG/ML IV SOLN
4.0000 mg | Freq: Once | INTRAVENOUS | Status: AC
  Administered 2020-03-28: 4 mg via INTRAVENOUS
  Filled 2020-03-28: qty 1

## 2020-03-28 MED ORDER — MORPHINE SULFATE (PF) 4 MG/ML IV SOLN
4.0000 mg | INTRAVENOUS | Status: DC | PRN
  Administered 2020-03-28: 4 mg via INTRAVENOUS
  Filled 2020-03-28: qty 1

## 2020-03-28 MED ORDER — SODIUM CHLORIDE 0.9 % IV SOLN
80.0000 mg | Freq: Two times a day (BID) | INTRAVENOUS | Status: DC
  Administered 2020-03-29 – 2020-04-01 (×8): 80 mg via INTRAVENOUS
  Filled 2020-03-28 (×9): qty 80

## 2020-03-28 MED ORDER — ONDANSETRON HCL 4 MG/2ML IJ SOLN
4.0000 mg | Freq: Once | INTRAMUSCULAR | Status: AC
  Administered 2020-03-28: 4 mg via INTRAVENOUS
  Filled 2020-03-28: qty 2

## 2020-03-28 MED ORDER — ACETAMINOPHEN 650 MG RE SUPP: 650.0000 mg | Freq: Four times a day (QID) | RECTAL | Status: DC | PRN

## 2020-03-28 MED ORDER — ONDANSETRON HCL 4 MG/2ML IJ SOLN
4.0000 mg | Freq: Four times a day (QID) | INTRAMUSCULAR | Status: DC | PRN
  Administered 2020-03-28: 4 mg via INTRAVENOUS
  Filled 2020-03-28: qty 2

## 2020-03-28 MED ORDER — METRONIDAZOLE IN NACL 5-0.79 MG/ML-% IV SOLN
500.0000 mg | Freq: Three times a day (TID) | INTRAVENOUS | Status: DC
  Administered 2020-03-29 – 2020-04-01 (×11): 500 mg via INTRAVENOUS
  Filled 2020-03-28 (×11): qty 100

## 2020-03-28 NOTE — Progress Notes (Signed)
Pharmacy Antibiotic Note  Melanie Olson is a 50 y.o. female admitted on 03/28/2020 with intra-abdominal infection.  Pharmacy has been consulted for fluconazole dosing.  Plan: Fluconazole 400 mg IV q 24 hrs.  Height: 5\' 2"  (157.5 cm) IBW/kg (Calculated) : 50.1  Temp (24hrs), Avg:98.3 F (36.8 C), Min:98.1 F (36.7 C), Max:98.4 F (36.9 C)  Recent Labs  Lab 03/22/20 0312 03/23/20 0434 03/24/20 0525 03/27/20 1728 03/27/20 2057  WBC 5.8 3.2* 7.7 7.0  --   CREATININE 0.47 0.54 0.47 0.57  --   LATICACIDVEN  --   --   --   --  1.1    Estimated Creatinine Clearance: 82.6 mL/min (by C-G formula based on SCr of 0.57 mg/dL).    Allergies  Allergen Reactions  . Penicillins Rash     Thank you for allowing pharmacy to be a part of this patient's care.  03/29/20, Reece Leader, BCCP Clinical Pharmacist  03/28/2020 10:01 PM   [facility] pharmacy phone numbers are listed on [email or web address]

## 2020-03-28 NOTE — ED Notes (Signed)
Report to [facility] at this time. 5-10 mins en route.

## 2020-03-28 NOTE — H&P (Signed)
History and Physical    Melanie Olson PPJ:[id number] DOB: 1970-08-08 DOA: 03/28/2020  PCP: [facility]  Patient coming from: Home  I have personally briefly reviewed patient's old medical records in [facility]  Chief Complaint: Abd pain  HPI: Melanie Olson is a 50 y.o. female with medical history significant of Roux-en-y gastric bypass, recent GIB admission 2/12-2/15 due to an ulcer at the gastro-jejunal anastomosis seen on EGD, iron deficiency anemia, NASH on imaging.  Pt presented to the ED yesterday (2/18) with c/o severe (9/10) epigastric abd pain with radiation to RUQ, associated N/V.  Symptoms constant, worse with PO intake, no other exacerbating or relieving factors.  No CP, SOB, diarrhea.     ED Course: Pt was found to have contained perforated ulcer near the anastomotic line on CT scan.  HGB 10.8 up from 9.0 on 2/15.  Pt transferred to [facility] for surgical evaluation and further management.   Review of Systems: As per HPI, otherwise all review of systems negative.  Past Medical History:  Diagnosis Date  . Abdominal wall ulcer (HCC)   . Depression   . GERD (gastroesophageal reflux disease)   . HSV (herpes simplex virus) infection   . Migraine     Past Surgical History:  Procedure Laterality Date  . CHOLECYSTECTOMY    . COLONOSCOPY WITH PROPOFOL N/A 07/11/2019   Procedure: COLONOSCOPY WITH PROPOFOL;  Surgeon: Pasty Spillers, MD;  Location: [facility] ENDOSCOPY;  Service: Endoscopy;  Laterality: N/A;  . ESOPHAGOGASTRODUODENOSCOPY (EGD) WITH PROPOFOL N/A 07/11/2019   Procedure: ESOPHAGOGASTRODUODENOSCOPY (EGD) WITH PROPOFOL;  Surgeon: Pasty Spillers, MD;  Location: [facility] ENDOSCOPY;  Service: Endoscopy;  Laterality: N/A;  . ESOPHAGOGASTRODUODENOSCOPY (EGD) WITH PROPOFOL N/A 03/23/2020   Procedure: ESOPHAGOGASTRODUODENOSCOPY (EGD) WITH PROPOFOL;  Surgeon: Pasty Spillers, MD;  Location: [facility] ENDOSCOPY;  Service: Endoscopy;   Laterality: N/A;  . GASTRIC BYPASS  2006  . tummy tuck       reports that she has never smoked. She has never used smokeless tobacco. She reports that she does not drink alcohol and does not use drugs.  Allergies  Allergen Reactions  . Penicillins Rash    Family History  Problem Relation Age of Onset  . Stroke Father   . Diabetes Maternal Grandmother   . Heart disease Paternal Grandmother      Prior to Admission medications   Medication Sig Start Date End Date Taking? Authorizing Provider  acetaminophen (TYLENOL) 650 MG CR tablet Take 650 mg by mouth every 8 (eight) hours as needed for pain.    [provider]  acyclovir (ZOVIRAX) 200 MG capsule Take 2 capsules by mouth in the morning and at bedtime.    [provider]  amitriptyline (ELAVIL) 25 MG tablet Take 25 mg by mouth at bedtime.    [provider]  buPROPion (WELLBUTRIN XL) 150 MG 24 hr tablet Take 150 mg by mouth daily. 11/09/19   [provider]  cyclobenzaprine (FLEXERIL) 10 MG tablet Take 10 mg by mouth 3 (three) times daily as needed for muscle spasms.    [provider]  pantoprazole (PROTONIX) 40 MG tablet Take 1 tablet (40 mg total) by mouth 2 (two) times daily. 06/01/19   Emily Filbert, MD  sucralfate (CARAFATE) 1 GM/10ML suspension Take 10 mLs (1 g total) by mouth 4 (four) times daily for 14 days. 03/24/20 04/07/20  Marrion Coy, MD    Physical Exam: Vitals:   03/28/20 2130 03/28/20 2152  BP:  113/75   Pulse: 77   Resp: 18   Temp: 98.4 F (36.9 C)   TempSrc: Oral   SpO2: 98%   Height:  5\' 2"  (1.575 m)    Constitutional: NAD, calm, comfortable Eyes: PERRL, lids and conjunctivae normal ENMT: Mucous membranes are moist. Posterior pharynx clear of any exudate or lesions.Normal dentition.  Neck: normal, supple, no masses, no thyromegaly Respiratory: clear to auscultation bilaterally, no wheezing, no crackles. Normal respiratory effort. No accessory muscle use.   Cardiovascular: Regular rate and rhythm, no murmurs / rubs / gallops. No extremity edema. 2+ pedal pulses. No carotid bruits.  Abdomen: TTP Musculoskeletal: no clubbing / cyanosis. No joint deformity upper and lower extremities. Good ROM, no contractures. Normal muscle tone.  Skin: no rashes, lesions, ulcers. No induration Neurologic: CN 2-12 grossly intact. Sensation intact, DTR normal. Strength 5/5 in all 4.  Psychiatric: Normal judgment and insight. Alert and oriented x 3. Normal mood.    Labs on Admission: I have personally reviewed following labs and imaging studies  CBC: Recent Labs  Lab 03/22/20 0312 03/22/20 1553 03/23/20 0434 03/23/20 1013 03/23/20 1323 03/24/20 0525 03/27/20 1728  WBC 5.8  --  3.2*  --   --  7.7 7.0  NEUTROABS  --   --  1.7  --   --  6.0  --   HGB 6.9*   < > 8.1* 8.4* 8.3* 9.0* 10.8*  HCT 24.6*  --  27.8*  --   --  30.8* 37.2  MCV 68.7*  --  69.3*  --   --  69.4* 72.8*  PLT 299  --  343  --   --  370 627*   < > = values in this interval not displayed.   Basic Metabolic Panel: Recent Labs  Lab 03/22/20 0312 03/23/20 0434 03/24/20 0525 03/27/20 1728  NA 137 140 137 137  K 3.1* 3.4* 3.3* 3.5  CL 106 106 105 102  CO2 24 25 25 25   GLUCOSE 91 90 94 101*  BUN 8 6 <5* 9  CREATININE 0.47 0.54 0.47 0.57  CALCIUM 7.9* 8.1* 8.4* 9.0  MG  --  2.0  --   --    GFR: Estimated Creatinine Clearance: 82.6 mL/min (by C-G formula based on SCr of 0.57 mg/dL). Liver Function Tests: Recent Labs  Lab 03/23/20 0434 03/27/20 1728  AST 40 19  ALT 58* 30  ALKPHOS 155* 139*  BILITOT 0.8 0.9  PROT 5.8* 7.8  ALBUMIN 2.9* 3.9   Recent Labs  Lab 03/27/20 1728  LIPASE 26   No results for input(s): AMMONIA in the last 168 hours. Coagulation Profile: No results for input(s): INR, PROTIME in the last 168 hours. Cardiac Enzymes: No results for input(s): CKTOTAL, CKMB, CKMBINDEX, TROPONINI in the last 168 hours. BNP (last 3 results) No results for input(s):  PROBNP in the last 8760 hours. HbA1C: No results for input(s): HGBA1C in the last 72 hours. CBG: No results for input(s): GLUCAP in the last 168 hours. Lipid Profile: No results for input(s): CHOL, HDL, LDLCALC, TRIG, CHOLHDL, LDLDIRECT in the last 72 hours. Thyroid Function Tests: No results for input(s): TSH, T4TOTAL, FREET4, T3FREE, THYROIDAB in the last 72 hours. Anemia Panel: No results for input(s): VITAMINB12, FOLATE, FERRITIN, TIBC, IRON, RETICCTPCT in the last 72 hours. Urine analysis:    Component Value Date/Time   COLORURINE YELLOW (A) 03/27/2020 2020   APPEARANCEUR HAZY (A) 03/27/2020 2020   APPEARANCEUR Hazy 08/23/2012 0628   LABSPEC 1.020 03/27/2020  2020   LABSPEC 1.026 08/23/2012 0628   PHURINE 5.0 03/27/2020 2020   GLUCOSEU NEGATIVE 03/27/2020 2020   GLUCOSEU Negative 08/23/2012 0628   HGBUR NEGATIVE 03/27/2020 2020   BILIRUBINUR NEGATIVE 03/27/2020 2020   BILIRUBINUR Negative 08/23/2012 0628   KETONESUR 5 (A) 03/27/2020 2020   PROTEINUR NEGATIVE 03/27/2020 2020   NITRITE NEGATIVE 03/27/2020 2020   LEUKOCYTESUR NEGATIVE 03/27/2020 2020   LEUKOCYTESUR 1+ 08/23/2012 0628    Radiological Exams on Admission: CT Abdomen Pelvis W Contrast  Result Date: 03/27/2020 CLINICAL DATA:  Acute, nonlocalized abdominal pain, recent discharge on Tuesday, ulcer at the anastomosis from gastric bypass, anemia requiring transfusion EXAM: CT ABDOMEN AND PELVIS WITH CONTRAST TECHNIQUE: Multidetector CT imaging of the abdomen and pelvis was performed using the standard protocol following bolus administration of intravenous contrast. CONTRAST:  100mL OMNIPAQUE IOHEXOL 300 MG/ML  SOLN COMPARISON:  CT 03/21/2020 FINDINGS: Lower chest: Lung bases are clear. Normal heart size. No pericardial effusion. Hepatobiliary: Diffuse hepatic hypoattenuation compatible with hepatic steatosis. Some more focal infiltration also noted near the falciform ligament. Smooth liver surface contour. No concerning  focal liver lesion. Prior cholecystectomy. Slight prominence of the biliary tree is similar to prior. No visible calcified gallstone. Pancreas: No pancreatic ductal dilatation or surrounding inflammatory changes. Spleen: Normal in size. No concerning splenic lesions. Adrenals/Urinary Tract: Normal adrenal glands. Kidneys are normally located with symmetric enhancementand excretion with bilateral extrarenal pelves. Small subcentimeter hypoattenuating focus in the right kidney (7/10) too small to fully characterize on CT imaging but statistically likely benign. Bilateral parapelvic cysts are unchanged prior. No suspicious renal lesion, urolithiasis or hydronephrosis. Urinary bladder is largely decompressed at the time of exam and therefore poorly evaluated by CT imaging. 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 There is some increasing stranding noted adjacent the anastomotic line with what appears to be a contained, perforated ulcer (2/22, 5/35). No frank spillage succus nor free air with peritoneum. Distal anastomosis appears patent. No obstructive changes are evident. No other small bowel thickening or dilatation. No colonic dilatation or wall thickening. A normal appendix is visualized. Vascular/Lymphatic: No significant vascular findings are present. No enlarged abdominal or pelvic lymph nodes. Reproductive: Anterior left fundal fibroid, similar to prior with slight distortion of the fundal endometrium. Simple appearing fluid attenuation cysts in the right adnexa. No concerning adnexal lesions. Other: Stranding and inflammation centered upon the contained perforation of a marginal ulcer near the gastrojejunal anastomosis in the upper abdomen. No frank spillage of succus or free air. Musculoskeletal: Multilevel degenerative changes are present in the imaged portions of the spine. No acute osseous abnormality or suspicious osseous lesion.  IMPRESSION: 1. 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 Increasing conspicuity from the recent comparison. No frank spillage of succus or free air with peritoneum. 2. Hepatic steatosis. 3. Fibroid uterus. These results were called by telephone at the time of interpretation on 03/27/2020 at 8:41 pm to provider [facility] , who verbally acknowledged these results. Electronically Signed   By: Kreg ShropshirePrice  DeHay M.D.   On: 03/27/2020 20:42    EKG: Independently reviewed.  Assessment/Plan Principal Problem:   Perforated ulcer (HCC) Active Problems:   Severe anemia   Ulcer at site of surgical anastomosis following bypass of stomach   NASH (nonalcoholic steatohepatitis)  1. Gastro-jejunal anastomotic ulcer with perforation - 1. Contained perf on CT scan 2. Per Dr. Bedelia Person 1. Broad spectrum ABx, rocephin / flagyl is fine 2. Add fluconazole 3. Consult IR for drainage in AM 3. Will also put on protonix 80mg  IV BID 4. Strict NPO 5. IVF: LR at 125 6. Zofran PRN nausea 7. Morphine PRN pain 2. Anemia, recent GIB from ulcer - 1. PPI as above 2. Got IV iron during hospital stay last week 3. Repeat CBC now and again in AM 3. NASH - 1. Fatty liver on last admit  DVT prophylaxis: SCDs - had GIB just last week from ulcer Code Status: Full Family Communication: No family in room Disposition Plan: Home after management of perforated ulcer Consults called: Dr. [place] Admission status: Admit to inpatient  Severity of Illness: The appropriate patient status for this patient is INPATIENT. Inpatient status is judged to be reasonable and necessary in order to provide the required intensity of service to ensure the patient's safety. The patient's presenting symptoms, physical exam findings,  and initial radiographic and laboratory data in the context of their chronic comorbidities is felt to place them at high risk for further clinical deterioration. Furthermore, it is not anticipated that the patient will be medically stable for discharge from the hospital within 2 midnights of admission. The following factors support the patient status of inpatient.   IP status due to perforated GI ulcer.   * I certify that at the point of admission it is my clinical judgment that the patient will require inpatient hospital care spanning beyond 2 midnights from the point of admission due to high intensity of service, high risk for further deterioration and high frequency of surveillance required.*    Kardell Virgil M. DO [facility]  How to contact the [facility] Attending or Consulting provider 7A - 7P or covering provider during after hours 7P -7A, for this patient?  1. Check the care team in [facility] and look for a) attending/consulting [facility] provider listed and b) the [facility] team listed 2. Log into [email or web address]  Amion Physician Scheduling and messaging for groups and whole hospitals  On call and physician scheduling software for group practices, residents, hospitalists and other medical providers for call, clinic, rotation and shift schedules. OnCall [employer] is a hospital-wide system for scheduling doctors and paging doctors on call. EasyPlot is for scientific plotting and data analysis.  [email or web address]  and use [employer] universal password to access. If you do not have the password, please contact the hospital operator.  3. Locate the [facility] provider you are looking for under [facility] and page to a number that you can be directly reached. 4. If you still have difficulty reaching the provider, please page the [facility] (Director on Call) for the Hospitalists listed on amion for assistance.  03/28/2020, 11:33 PM

## 2020-03-28 NOTE — ED Notes (Signed)
Message provider , pt requesting pain med

## 2020-03-28 NOTE — ED Notes (Signed)
Called [facility] spoke to [facility] @ this time no bed assignment available

## 2020-03-28 NOTE — Social Work (Signed)
TOC C/M social worker received a call from the patient's mother, Isabele Lollar [phone].   CSW explain HIPPA.   Mother was inquiring on how to apply for Medicaid for this patient. Directed Mrs. Tilman Neat to [facility] Department of Social Services.

## 2020-03-28 NOTE — ED Notes (Signed)
Pt and husband updated on bed assignment. Pt sts pain improved at this time. Pt up to use toilet independently.

## 2020-03-29 ENCOUNTER — Encounter (HOSPITAL_COMMUNITY): Payer: Self-pay | Admitting: Internal Medicine

## 2020-03-29 ENCOUNTER — Other Ambulatory Visit: Payer: Self-pay

## 2020-03-29 LAB — COMPREHENSIVE METABOLIC PANEL
ALT: 124 U/L — ABNORMAL HIGH (ref 0–44)
AST: 250 U/L — ABNORMAL HIGH (ref 15–41)
Albumin: 3.1 g/dL — ABNORMAL LOW (ref 3.5–5.0)
Alkaline Phosphatase: 228 U/L — ABNORMAL HIGH (ref 38–126)
Anion gap: 9 (ref 5–15)
BUN: 7 mg/dL (ref 6–20)
CO2: 25 mmol/L (ref 22–32)
Calcium: 8.7 mg/dL — ABNORMAL LOW (ref 8.9–10.3)
Chloride: 103 mmol/L (ref 98–111)
Creatinine, Ser: 0.59 mg/dL (ref 0.44–1.00)
GFR, Estimated: >60 mL/min (ref >60)
Glucose, Bld: 102 mg/dL — ABNORMAL HIGH (ref 70–99)
Potassium: 3.7 mmol/L (ref 3.5–5.1)
Sodium: 137 mmol/L (ref 135–145)
Total Bilirubin: 1.1 mg/dL (ref 0.3–1.2)
Total Protein: 6.3 g/dL — ABNORMAL LOW (ref 6.5–8.1)

## 2020-03-29 LAB — BASIC METABOLIC PANEL
Anion gap: 9 (ref 5–15)
BUN: 6 mg/dL (ref 6–20)
CO2: 25 mmol/L (ref 22–32)
Calcium: 8.5 mg/dL — ABNORMAL LOW (ref 8.9–10.3)
Chloride: 104 mmol/L (ref 98–111)
Creatinine, Ser: 0.62 mg/dL (ref 0.44–1.00)
GFR, Estimated: >60 mL/min (ref >60)
Glucose, Bld: 96 mg/dL (ref 70–99)
Potassium: 3.8 mmol/L (ref 3.5–5.1)
Sodium: 138 mmol/L (ref 135–145)

## 2020-03-29 LAB — CBC
HCT: 33.2 % — ABNORMAL LOW (ref 36.0–46.0)
Hemoglobin: 9.2 g/dL — ABNORMAL LOW (ref 12.0–15.0)
MCH: 21.1 pg — ABNORMAL LOW (ref 26.0–34.0)
MCHC: 27.7 g/dL — ABNORMAL LOW (ref 30.0–36.0)
MCV: 76 fL — ABNORMAL LOW (ref 80.0–100.0)
Platelets: 471 10*3/uL — ABNORMAL HIGH (ref 150–400)
RBC: 4.37 MIL/uL (ref 3.87–5.11)
RDW: 27.2 % — ABNORMAL HIGH (ref 11.5–15.5)
WBC: 8.9 10*3/uL (ref 4.0–10.5)
nRBC: 0 % (ref 0.0–0.2)

## 2020-03-29 LAB — TYPE AND SCREEN
ABO/RH(D): A POS
Antibody Screen: NEGATIVE

## 2020-03-29 MED ORDER — BISACODYL 10 MG RE SUPP
10.0000 mg | Freq: Every day | RECTAL | Status: DC
  Administered 2020-03-29 – 2020-03-30 (×2): 10 mg via RECTAL
  Filled 2020-03-29 (×4): qty 1

## 2020-03-29 NOTE — Progress Notes (Signed)
New Admission Note:   Arrival Method: Via [facility] from [facility] - She is from home with her son and Mom Mental Orientation:  A & O x 4 Telemetry: Tele #[id number] - NSR Assessment: Completed Skin:  Dry but Intact IV:  Rt AC and Rt Forearm NSL Pain: c/o abdomen pain - intermittent in nature Tubes:  None Safety Measures: Safety Fall Prevention Plan has been given, discussed and signed Admission: Completed Orientation: Patient has been orientated to the room, unit and staff.  Family:  None at bedside  Patient is weak and dizzy when standing.  She know to call RN for assistance out of bed.  She does have her cell phone, phone charger, clothes, purse, and wallet at the bedside.  She does not want her belongings to go to the hospital safe and understands that [facility] is not responsible for any items lost or potentially stolen.  Orders have been reviewed and implemented. Will continue to monitor the patient. Call light has been placed within reach and bed alarm has been activated.   Bernie Covey RN- BC, [facility] Phone number: [phone]

## 2020-03-29 NOTE — Progress Notes (Signed)
[facility]  PROGRESS NOTE  Melanie Olson GQB:[id number] DOB: 06/13/1970 DOA: 03/28/2020 PCP: [facility]   Brief HPI:    50 year old female with medical history of Roux-en-Y gastric bypass, recent GI bleed admission from 03/21/2020 03/24/2020 due to ulcer at the gastrojejunal anastomosis seen on EGD, iron deficiency anemia, [name] on imaging presented to the ED with complaints of severe epigastric pain with radiation to right upper quadrant.  In the ED CT scan showed perforated ulcer noted at midline.  General surgery was consulted.   Subjective   Patient seen and examined, denies abdominal pain.  No nausea or vomiting.   Assessment/Plan:     1. Gastrojejunal anastomotic ulcer with perforation-General surgery has been consulted, patient is n.p.o.  Started on Rocephin, Flagyl, fluconazole.  Continue IV LR at 125 mill per hour.  Continue as needed Zofran for nausea and vomiting.  Morphine as needed for pain. 2. Anemia from GI bleed- Continue Protonix 80 mg IV 12 hours 3. Transaminitis-patient has significant elevated LFTs, patient has history of [place], AST and ALT are elevated.  Will follow LFTs in a.m., if continues to be high will consider GI consultation.     COVID-19 Labs  No results for input(s): DDIMER, FERRITIN, LDH, CRP in the last 72 hours.  Lab Results  Component Value Date   SARSCOV2NAA NEGATIVE 03/27/2020   SARSCOV2NAA NEGATIVE 03/21/2020   SARSCOV2NAA Not Detected 09/25/2019   SARSCOV2NAA NEGATIVE 07/09/2019     Scheduled medications:   . bisacodyl  10 mg Rectal Daily         CBG: No results for input(s): GLUCAP in the last 168 hours.  SpO2: 96 %    CBC: Recent Labs  Lab 03/23/20 0434 03/23/20 1013 03/23/20 1323 03/24/20 0525 03/27/20 1728 03/28/20 2311 03/29/20 0432  WBC 3.2*  --   --  7.7 7.0 12.2* 8.9  NEUTROABS 1.7  --   --  6.0  --   --   --   HGB 8.1*   < > 8.3* 9.0* 10.8* 10.3* 9.2*  HCT 27.8*  --   --   30.8* 37.2 34.9* 33.2*  MCV 69.3*  --   --  69.4* 72.8* 73.6* 76.0*  PLT 343  --   --  370 627* 474* 471*   < > = values in this interval not displayed.    Basic Metabolic Panel: Recent Labs  Lab 03/23/20 0434 03/24/20 0525 03/27/20 1728 03/28/20 2311 03/29/20 0432  NA 140 137 137 137 138  K 3.4* 3.3* 3.5 3.7 3.8  CL 106 105 102 103 104  CO2 25 25 25 25 25   GLUCOSE 90 94 101* 102* 96  BUN 6 <5* 9 7 6   CREATININE 0.54 0.47 0.57 0.59 0.62  CALCIUM 8.1* 8.4* 9.0 8.7* 8.5*  MG 2.0  --   --   --   --      Liver Function Tests: Recent Labs  Lab 03/23/20 0434 03/27/20 1728 03/28/20 2311  AST 40 19 250*  ALT 58* 30 124*  ALKPHOS 155* 139* 228*  BILITOT 0.8 0.9 1.1  PROT 5.8* 7.8 6.3*  ALBUMIN 2.9* 3.9 3.1*     Antibiotics: Anti-infectives (From admission, onward)   Start     Dose/Rate Route Frequency Ordered Stop   03/28/20 2300  fluconazole (DIFLUCAN) IVPB 400 mg        400 mg 100 mL/hr over 120 Minutes Intravenous Every 24 hours 03/28/20 2159     03/28/20 2300  cefTRIAXone (ROCEPHIN) 2 g in sodium chloride 0.9 % 100 mL IVPB        2 g 200 mL/hr over 30 Minutes Intravenous Every 24 hours 03/28/20 2203     03/28/20 2300  metroNIDAZOLE (FLAGYL) IVPB 500 mg        500 mg 100 mL/hr over 60 Minutes Intravenous Every 8 hours 03/28/20 2203         DVT prophylaxis: SCDs  Code Status: Full code  Family Communication: No family at bedside   Consultants:    Procedures:      Objective   Vitals:   03/29/20 0102 03/29/20 0451 03/29/20 0641 03/29/20 0929  BP: 100/60  103/64 102/60  Pulse: 71  69 66  Resp: 18  18 16   Temp: 98.9 F (37.2 C)  98.3 F (36.8 C) 98.4 F (36.9 C)  TempSrc: Oral  Oral Oral  SpO2: 94%  99% 96%  Weight:  78.4 kg    Height:        Intake/Output Summary (Last 24 hours) at 03/29/2020 1336 Last data filed at 03/29/2020 1144 Gross per 24 hour  Intake 1604.4 ml  Output 125 ml  Net 1479.4 ml    02/18 1901 - 02/20 0700 In:  817.1 [I.V.:217.1] Out: 125 [Urine:125]  Filed Weights   03/29/20 0451  Weight: 78.4 kg    Physical Examination:    General-appears in no acute distress  Heart-S1-S2, regular, no murmur auscultated  Lungs-clear to auscultation bilaterally, no wheezing or crackles auscultated  Abdomen-soft, nontender, no organomegaly  Extremities-no edema in the lower extremities  Neuro-alert, oriented x3, no focal deficit noted   Status is: Inpatient  Dispo: The patient is from: Home              Anticipated d/c is to: Home              Anticipated d/c date is: 04/03/2020              Patient currently not stable for discharge  Barrier to discharge-perforated anastomotic ulcer       Data Reviewed:   Recent Results (from the past 240 hour(s))  SARS CORONAVIRUS 2 (TAT 6-24 HRS) Nasopharyngeal Nasopharyngeal Swab     Status: None   Collection Time: 03/21/20  2:53 PM   Specimen: Nasopharyngeal Swab  Result Value Ref Range Status   SARS Coronavirus 2 NEGATIVE NEGATIVE Final    Comment: (NOTE) SARS-CoV-2 target nucleic acids are NOT DETECTED.  The SARS-CoV-2 RNA is generally detectable in upper and lower respiratory specimens during the acute phase of infection. Negative results do not preclude SARS-CoV-2 infection, do not rule out co-infections with other pathogens, and should not be used as the sole basis for treatment or other patient management decisions. Negative results must be combined with clinical observations, patient history, and epidemiological information. The expected result is Negative.  Fact Sheet for Patients: 05/19/20  Fact Sheet for Healthcare Providers: HairSlick.no  This test is not yet approved or cleared by the [place] FDA and  has been authorized for detection and/or diagnosis of SARS-CoV-2 by FDA under an Emergency Use Authorization (EUA). This EUA will remain  in effect (meaning  this test can be used) for the duration of the COVID-19 declaration under Se ction 564(b)(1) of the Act, 21 U.S.C. section 360bbb-3(b)(1), unless the authorization is terminated or revoked sooner.  Performed at [facility], [address], [place], [place]   Resp Panel by RT-PCR (Flu A&B,  Covid) Nasopharyngeal Swab     Status: None   Collection Time: 03/27/20  9:30 PM   Specimen: Nasopharyngeal Swab; Nasopharyngeal(NP) swabs in vial transport medium  Result Value Ref Range Status   SARS Coronavirus 2 by RT PCR NEGATIVE NEGATIVE Final    Comment: (NOTE) SARS-CoV-2 target nucleic acids are NOT DETECTED.  The SARS-CoV-2 RNA is generally detectable in upper respiratory specimens during the acute phase of infection. The lowest concentration of SARS-CoV-2 viral copies this assay can detect is 138 copies/mL. A negative result does not preclude SARS-Cov-2 infection and should not be used as the sole basis for treatment or other patient management decisions. A negative result may occur with  improper specimen collection/handling, submission of specimen other than nasopharyngeal swab, presence of viral mutation(s) within the areas targeted by this assay, and inadequate number of viral copies(<138 copies/mL). A negative result must be combined with clinical observations, patient history, and epidemiological information. The expected result is Negative.  Fact Sheet for Patients:  BloggerCourse.com  Fact Sheet for Healthcare Providers:  SeriousBroker.it  This test is no t yet approved or cleared by the [place] FDA and  has been authorized for detection and/or diagnosis of SARS-CoV-2 by FDA under an Emergency Use Authorization (EUA). This EUA will remain  in effect (meaning this test can be used) for the duration of the COVID-19 declaration under Section 564(b)(1) of the Act, 21 U.S.C.section 360bbb-3(b)(1), unless the  authorization is terminated  or revoked sooner.       Influenza A by PCR NEGATIVE NEGATIVE Final   Influenza B by PCR NEGATIVE NEGATIVE Final    Comment: (NOTE) The Xpert Xpress SARS-CoV-2/FLU/RSV plus assay is intended as an aid in the diagnosis of influenza from Nasopharyngeal swab specimens and should not be used as a sole basis for treatment. Nasal washings and aspirates are unacceptable for Xpert Xpress SARS-CoV-2/FLU/RSV testing.  Fact Sheet for Patients: BloggerCourse.com  Fact Sheet for Healthcare Providers: SeriousBroker.it  This test is not yet approved or cleared by the [place] FDA and has been authorized for detection and/or diagnosis of SARS-CoV-2 by FDA under an Emergency Use Authorization (EUA). This EUA will remain in effect (meaning this test can be used) for the duration of the COVID-19 declaration under Section 564(b)(1) of the Act, 21 U.S.C. section 360bbb-3(b)(1), unless the authorization is terminated or revoked.  Performed at [facility], [address] [place], [place] [address]     Recent Labs  Lab 03/27/20 1728  LIPASE 26   No results for input(s): AMMONIA in the last 168 hours.  Cardiac Enzymes: No results for input(s): CKTOTAL, CKMB, CKMBINDEX, TROPONINI in the last 168 hours. BNP (last 3 results) No results for input(s): BNP in the last 8760 hours.  ProBNP (last 3 results) No results for input(s): PROBNP in the last 8760 hours.  Studies:  CT Abdomen Pelvis W Contrast  Result Date: 03/27/2020 CLINICAL DATA:  Acute, nonlocalized abdominal pain, recent discharge on Tuesday, ulcer at the anastomosis from gastric bypass, anemia requiring transfusion EXAM: CT ABDOMEN AND PELVIS WITH CONTRAST TECHNIQUE: Multidetector CT imaging of the abdomen and pelvis was performed using the standard protocol following bolus administration of intravenous contrast. CONTRAST:  OMNIPAQUE  IOHEXOL 300 MG/ML  SOLN COMPARISON:  CT 03/21/2020 FINDINGS: Lower chest: Lung bases are clear. Normal heart size. No pericardial effusion. Hepatobiliary: Diffuse hepatic hypoattenuation compatible with hepatic steatosis. Some more focal infiltration also noted near the falciform ligament. Smooth liver surface contour. No concerning focal  liver lesion. Prior cholecystectomy. Slight prominence of the biliary tree is similar to prior. No visible calcified gallstone. Pancreas: No pancreatic ductal dilatation or surrounding inflammatory changes. Spleen: Normal in size. No concerning splenic lesions. Adrenals/Urinary Tract: Normal adrenal glands. Kidneys are normally located with symmetric enhancementand excretion with bilateral extrarenal pelves. Small subcentimeter hypoattenuating focus in the right kidney (7/10) too small to fully characterize on CT imaging but statistically likely benign. Bilateral parapelvic cysts are unchanged prior. No suspicious renal lesion, urolithiasis or hydronephrosis. Urinary bladder is largely decompressed at the time of exam and therefore poorly evaluated by CT imaging. 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 There is some increasing stranding noted adjacent the anastomotic line with what appears to be a contained, perforated ulcer (2/22, 5/35). No frank spillage succus nor free air with peritoneum. Distal anastomosis appears patent. No obstructive changes are evident. No other small bowel thickening or dilatation. No colonic dilatation or wall thickening. A normal appendix is visualized. Vascular/Lymphatic: No significant vascular findings are present. No enlarged abdominal or pelvic lymph nodes. Reproductive: Anterior left fundal fibroid, similar to prior with slight distortion of the fundal endometrium. Simple appearing fluid attenuation cysts in the right adnexa. No concerning adnexal lesions. Other: Stranding and  inflammation centered upon the contained perforation of a marginal ulcer near the gastrojejunal anastomosis in the upper abdomen. No frank spillage of succus or free air. Musculoskeletal: Multilevel degenerative changes are present in the imaged portions of the spine. No acute osseous abnormality or suspicious osseous lesion. IMPRESSION: 1. 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 Increasing conspicuity from the recent comparison. No frank spillage of succus or free air with peritoneum. 2. Hepatic steatosis. 3. Fibroid uterus. These results were called by telephone at the time of interpretation on 03/27/2020 at 8:41 pm to provider [facility] , who verbally acknowledged these results. Electronically Signed   By: Kreg ShropshirePrice  DeHay M.D.   On: 03/27/2020 20:42       Melanie IdeGagan S Argelia Olson   [facility] If 7PM-7AM, please contact night-coverage at [email or web address], Office  [phone]   03/29/2020, 1:36 PM  LOS: 1 day

## 2020-03-29 NOTE — Plan of Care (Signed)
°  Problem: Health Behavior/Discharge Planning: °Goal: Ability to manage health-related needs will improve °Outcome: Progressing °  °Problem: Clinical Measurements: °Goal: Diagnostic test results will improve °Outcome: Progressing °  °Problem: Pain Managment: °Goal: General experience of comfort will improve °Outcome: Progressing °  °

## 2020-03-29 NOTE — Consult Note (Signed)
Melanie Olson 1970/12/28  [id number].    Requesting MD: Dr. 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  She has a history of multiple "stomach ulcers" dating back to 2013 since she has been here in [place].  These have all apparently been treated at [facility].  She states that some of these have been bleeding.  She takes BID Protonix.  She denies taking any multivitamin.  She has not had any bariatric or gastroenterology follow-up otherwise.  She states she normally has constipation and thought she was having some abdominal pain from constipation last Saturday.  She presented to [facility] with this complaint.  She had a CT scan which revealed constipation but no other significant findings.  Gastroenterology was ultimately consulted who proceeded with an upper endoscopy and found an 8 mm ulcer at the gastrojejunal anastomosis.  The rest of her endoscopy was normal.  She was continued on her twice daily Protonix as well as what appears may be Carafate.  She was discharged home.  However after discharge home, the patient continued to have abdominal pain and now with nausea and vomiting.  She does state that at times immediately after eating her pain is improved but then worsens.  Because of worsening abdominal pain along with an inability to keep enteral nutrition down, she returned to [facility] for evaluation.  She underwent a repeat CT scan which revealed some increasing stranding and inflammation near the GJ anastomotic line with what appeared to be a possible contained perforation of the ulcer noted on her prior upper endoscopy.  No free fluid or free air was definitively noted.  The patient has been afebrile with essentially a  normal white blood cell count.  This did slightly increased yesterday to 12.2 but has normalized again today.  She denies any hematemesis or melena.  She has been transferred to [facility] for bariatric evaluation.  ROS: ROS: Please see HPI otherwise all other systems have been reviewed and are negative.  Family History  Problem Relation Age of Onset  . Stroke Father   . Diabetes Maternal Grandmother   . Heart disease Paternal Grandmother     Past Medical History:  Diagnosis Date  . Abdominal wall ulcer (HCC)   . Depression   . GERD (gastroesophageal reflux disease)   . HSV (herpes simplex virus) infection   . Migraine     Past Surgical History:  Procedure Laterality Date  . CHOLECYSTECTOMY    . COLONOSCOPY WITH PROPOFOL N/A 07/11/2019   Procedure: COLONOSCOPY WITH PROPOFOL;  Surgeon: Pasty Spillers, MD;  Location: [facility] ENDOSCOPY;  Service: Endoscopy;  Laterality: N/A;  . ESOPHAGOGASTRODUODENOSCOPY (EGD) WITH PROPOFOL N/A 07/11/2019   Procedure: ESOPHAGOGASTRODUODENOSCOPY (EGD) WITH PROPOFOL;  Surgeon: Pasty Spillers, MD;  Location: [facility] ENDOSCOPY;  Service: Endoscopy;  Laterality: N/A;  . ESOPHAGOGASTRODUODENOSCOPY (EGD) WITH PROPOFOL N/A 03/23/2020   Procedure: ESOPHAGOGASTRODUODENOSCOPY (EGD) WITH PROPOFOL;  Surgeon: Pasty Spillers, MD;  Location: [facility] ENDOSCOPY;  Service: Endoscopy;  Laterality: N/A;  . GASTRIC BYPASS  2006  . tummy tuck      Social History:  reports that she has never smoked. She has never used smokeless tobacco. She reports that she does not drink alcohol and does not use drugs.  Allergies:  Allergies  Allergen Reactions  . Penicillins Rash    Medications Prior to Admission  Medication Sig Dispense Refill  . acetaminophen (TYLENOL) 650 MG CR tablet Take 650 mg by mouth every 8 (eight) hours as needed for pain.    Marland Kitchen acyclovir (ZOVIRAX) 200 MG capsule Take 2 capsules by mouth in the morning and at bedtime.    Marland Kitchen amitriptyline  (ELAVIL) 25 MG tablet Take 25 mg by mouth at bedtime.    Marland Kitchen buPROPion (WELLBUTRIN XL) 150 MG 24 hr tablet Take 150 mg by mouth daily.    . cyclobenzaprine (FLEXERIL) 10 MG tablet Take 10 mg by mouth at bedtime as needed for muscle spasms.    . pantoprazole (PROTONIX) 40 MG tablet Take 1 tablet (40 mg total) by mouth 2 (two) times daily. 30 tablet 0  . sucralfate (CARAFATE) 1 GM/10ML suspension Take 10 mLs (1 g total) by mouth 4 (four) times daily for 14 days. 560 mL 0     Physical Exam: Blood pressure 102/60, pulse 66, temperature 98.4 F (36.9 C), temperature source Oral, resp. rate 16, height 5\' 2"  (1.575 m), weight 78.4 kg, last menstrual period 03/14/2020, SpO2 96 %. General: pleasant, WD, WN white female who is laying in bed in NAD HEENT: head is normocephalic, atraumatic.  Sclera are noninjected.  PERRL.  Ears and nose without any masses or lesions.  Mouth is pink and dry Heart: regular, rate, and rhythm.  Normal s1,s2. No obvious murmurs, gallops, or rubs noted.  Palpable radial and pedal pulses bilaterally Lungs: CTAB, no wheezes, rhonchi, or rales noted.  Respiratory effort nonlabored Abd: soft, minimally tender in epigastrium and supraumbilical region, ND, +BS, no masses, hernias, or organomegaly MS: all 4 extremities are symmetrical with no cyanosis, clubbing, or edema. Skin: warm and dry with no masses, lesions, or rashes Neuro: Cranial nerves 2-12 grossly intact, sensation is normal throughout Psych: A&Ox3 with an appropriate affect.   Results for orders placed or performed during the hospital encounter of 03/28/20 (from the past 48 hour(s))  Type and screen [facility]     Status: None   Collection Time: 03/28/20 11:10 PM  Result Value Ref Range   ABO/RH(D) A POS    Antibody Screen NEG    Sample Expiration      03/31/2020,2359 Performed at [facility], [address], [place], [place]   CBC     Status: Abnormal   Collection Time:  03/28/20 11:11 PM  Result Value Ref Range   WBC 12.2 (H) 4.0 - 10.5 K/uL   RBC 4.74 3.87 - 5.11 MIL/uL   Hemoglobin 10.3 (L) 12.0 - 15.0 g/dL   HCT 03/30/20 (L) 49.6 - 75.9 %   MCV 73.6 (L) 80.0 - 100.0 fL   MCH 21.7 (L) 26.0 - 34.0 pg   MCHC 29.5 (L) 30.0 - 36.0 g/dL   RDW 16.3 (H) 84.6 - 65.9 %   Platelets 474 (H) 150 - 400 K/uL   nRBC 0.0 0.0 - 0.2 %    Comment: Performed at [facility], [address], [place], [place]  Comprehensive metabolic panel     Status: Abnormal   Collection Time: 03/28/20 11:11 PM  Result Value Ref Range   Sodium 137 135 - 145 mmol/L   Potassium 3.7 3.5 - 5.1 mmol/L   Chloride 103 98 - 111 mmol/L   CO2 25 22 - 32 mmol/L   Glucose, Bld 102 (H) 70 - 99  mg/dL    Comment: Glucose reference range applies only to samples taken after fasting for at least 8 hours.   BUN 7 6 - 20 mg/dL   Creatinine, Ser 1.610.59 0.44 - 1.00 mg/dL   Calcium 8.7 (L) 8.9 - 10.3 mg/dL   Total Protein 6.3 (L) 6.5 - 8.1 g/dL   Albumin 3.1 (L) 3.5 - 5.0 g/dL   AST 096250 (H) 15 - 41 U/L   ALT 124 (H) 0 - 44 U/L   Alkaline Phosphatase 228 (H) 38 - 126 U/L   Total Bilirubin 1.1 0.3 - 1.2 mg/dL   GFR, Estimated >04>60 >54>60 mL/min    Comment: (NOTE) Calculated using the CKD-EPI Creatinine Equation (2021)    Anion gap 9 5 - 15    Comment: Performed at [facility], [address] [place], [place] [address]  CBC     Status: Abnormal   Collection Time: 03/29/20  4:32 AM  Result Value Ref Range   WBC 8.9 4.0 - 10.5 K/uL   RBC 4.37 3.87 - 5.11 MIL/uL   Hemoglobin 9.2 (L) 12.0 - 15.0 g/dL   HCT 91.433.2 (L) 78.236.0 - 95.646.0 %   MCV 76.0 (L) 80.0 - 100.0 fL   MCH 21.1 (L) 26.0 - 34.0 pg   MCHC 27.7 (L) 30.0 - 36.0 g/dL   RDW 21.327.2 (H) 08.611.5 - 57.815.5 %   Platelets 471 (H) 150 - 400 K/uL   nRBC 0.0 0.0 - 0.2 %    Comment: Performed at [facility], [address] [place], [place] [address]  Basic metabolic panel     Status: Abnormal   Collection Time: 03/29/20  4:32 AM  Result  Value Ref Range   Sodium 138 135 - 145 mmol/L   Potassium 3.8 3.5 - 5.1 mmol/L   Chloride 104 98 - 111 mmol/L   CO2 25 22 - 32 mmol/L   Glucose, Bld 96 70 - 99 mg/dL    Comment: Glucose reference range applies only to samples taken after fasting for at least 8 hours.   BUN 6 6 - 20 mg/dL   Creatinine, Ser 9.520.62 0.44 - 1.00 mg/dL   Calcium 8.5 (L) 8.9 - 10.3 mg/dL   GFR, Estimated >84>60 >13>60 mL/min    Comment: (NOTE) Calculated using the CKD-EPI Creatinine Equation (2021)    Anion gap 9 5 - 15    Comment: Performed at [facility], [address] [place], [place] [address]   CT Abdomen Pelvis W Contrast  Result Date: 03/27/2020 CLINICAL DATA:  Acute, nonlocalized abdominal pain, recent discharge on Tuesday, ulcer at the anastomosis from gastric bypass, anemia requiring transfusion EXAM: CT ABDOMEN AND PELVIS WITH CONTRAST TECHNIQUE: Multidetector CT imaging of the abdomen and pelvis was performed using the standard protocol following bolus administration of intravenous contrast. CONTRAST:  100mL OMNIPAQUE IOHEXOL 300 MG/ML  SOLN COMPARISON:  CT 03/21/2020 FINDINGS: Lower chest: Lung bases are clear. Normal heart size. No pericardial effusion. Hepatobiliary: Diffuse hepatic hypoattenuation compatible with hepatic steatosis. Some more focal infiltration also noted near the falciform ligament. Smooth liver surface contour. No concerning focal liver lesion. Prior cholecystectomy. Slight prominence of the biliary tree is similar to prior. No visible calcified gallstone. Pancreas: No pancreatic ductal dilatation or surrounding inflammatory changes. Spleen: Normal in size. No concerning splenic lesions. Adrenals/Urinary Tract: Normal adrenal glands. Kidneys are normally located with symmetric enhancementand excretion with bilateral extrarenal pelves. Small subcentimeter hypoattenuating focus in the right kidney (7/10) too  small to fully characterize on CT imaging but statistically likely benign.  Bilateral parapelvic cysts are unchanged prior. No suspicious renal lesion, urolithiasis or hydronephrosis. Urinary bladder is largely decompressed at the time of exam and therefore poorly evaluated by CT imaging. 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 There is some increasing stranding noted adjacent the anastomotic line with what appears to be a contained, perforated ulcer (2/22, 5/35). No frank spillage succus nor free air with peritoneum. Distal anastomosis appears patent. No obstructive changes are evident. No other small bowel thickening or dilatation. No colonic dilatation or wall thickening. A normal appendix is visualized. Vascular/Lymphatic: No significant vascular findings are present. No enlarged abdominal or pelvic lymph nodes. Reproductive: Anterior left fundal fibroid, similar to prior with slight distortion of the fundal endometrium. Simple appearing fluid attenuation cysts in the right adnexa. No concerning adnexal lesions. Other: Stranding and inflammation centered upon the contained perforation of a marginal ulcer near the gastrojejunal anastomosis in the upper abdomen. No frank spillage of succus or free air. Musculoskeletal: Multilevel degenerative changes are present in the imaged portions of the spine. No acute osseous abnormality or suspicious osseous lesion. IMPRESSION: 1. 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 Increasing conspicuity from the recent comparison. No frank spillage of succus or free air with peritoneum. 2. Hepatic steatosis. 3. Fibroid uterus. These results were called by telephone at the time of interpretation on 03/27/2020 at 8:41 pm to provider [facility] , who verbally acknowledged these  results. Electronically Signed   By: Kreg Shropshire M.D.   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  She does have some stranding around her anastomosis; however, there is no frank evidence of contamination or pneumoperitoneum.  We would recommend keeping the patient n.p.o. at this time along with current antibiotic regimen and IV Protonix.  We will plan to perform an upper GI series tomorrow to evaluate for leak.  If this is negative, we will likely proceed with trying a clear liquid diet.  Given her history of recurrent ulcers, the patient would likely benefit from GI as well as bariatric follow-up moving forward as an outpatient.  We will continue to follow the patient as an inpatient.   FEN -n.p.o./IV fluids VTE -SCDs ID -Rocephin/Flagyl/Diflucan  Letha Cape, [facility] Surgery 03/29/2020, 11:20 AM Please see Amion for pager number during day hours 7:00am-4:30pm or 7:00am -11:30am on weekends

## 2020-03-30 ENCOUNTER — Inpatient Hospital Stay (HOSPITAL_COMMUNITY): Payer: Self-pay

## 2020-03-30 LAB — CBC
HCT: 32.1 % — ABNORMAL LOW (ref 36.0–46.0)
Hemoglobin: 9 g/dL — ABNORMAL LOW (ref 12.0–15.0)
MCH: 21.3 pg — ABNORMAL LOW (ref 26.0–34.0)
MCHC: 28 g/dL — ABNORMAL LOW (ref 30.0–36.0)
MCV: 76.1 fL — ABNORMAL LOW (ref 80.0–100.0)
Platelets: 391 10*3/uL (ref 150–400)
RBC: 4.22 MIL/uL (ref 3.87–5.11)
RDW: 26.9 % — ABNORMAL HIGH (ref 11.5–15.5)
WBC: 3.7 10*3/uL — ABNORMAL LOW (ref 4.0–10.5)
nRBC: 0 % (ref 0.0–0.2)

## 2020-03-30 LAB — COMPREHENSIVE METABOLIC PANEL
ALT: 71 U/L — ABNORMAL HIGH (ref 0–44)
AST: 55 U/L — ABNORMAL HIGH (ref 15–41)
Albumin: 2.5 g/dL — ABNORMAL LOW (ref 3.5–5.0)
Alkaline Phosphatase: 161 U/L — ABNORMAL HIGH (ref 38–126)
Anion gap: 10 (ref 5–15)
BUN: 6 mg/dL (ref 6–20)
CO2: 23 mmol/L (ref 22–32)
Calcium: 7.9 mg/dL — ABNORMAL LOW (ref 8.9–10.3)
Chloride: 104 mmol/L (ref 98–111)
Creatinine, Ser: 0.73 mg/dL (ref 0.44–1.00)
GFR, Estimated: >60 mL/min (ref >60)
Glucose, Bld: 58 mg/dL — ABNORMAL LOW (ref 70–99)
Potassium: 3.5 mmol/L (ref 3.5–5.1)
Sodium: 137 mmol/L (ref 135–145)
Total Bilirubin: 0.6 mg/dL (ref 0.3–1.2)
Total Protein: 5.3 g/dL — ABNORMAL LOW (ref 6.5–8.1)

## 2020-03-30 MED ORDER — IOHEXOL 300 MG/ML  SOLN
75.0000 mL | Freq: Once | INTRAMUSCULAR | Status: AC | PRN
  Administered 2020-03-30: 75 mL via ORAL

## 2020-03-30 MED ORDER — SUCRALFATE 1 GM/10ML PO SUSP
1.0000 g | Freq: Three times a day (TID) | ORAL | Status: DC
  Administered 2020-03-30 – 2020-04-01 (×8): 1 g via ORAL
  Filled 2020-03-30 (×9): qty 10

## 2020-03-30 NOTE — Progress Notes (Signed)
[facility]  PROGRESS NOTE  Melanie Olson QPR:[id number] DOB: 1970-12-21 DOA: 03/28/2020 PCP: [facility]   Brief HPI:    50 year old female with medical history of Roux-en-Y gastric bypass, recent GI bleed admission from 03/21/2020 03/24/2020 due to ulcer at the gastrojejunal anastomosis seen on EGD, iron deficiency anemia, [name] on imaging presented to the ED with complaints of severe epigastric pain with radiation to right upper quadrant.  In the ED CT scan showed perforated ulcer noted at midline.  General surgery was consulted.   Subjective   Patient seen and examined, denies abdominal pain.  Plan for upper GI series today.  Appreciate general surgery input.   Assessment/Plan:     1. Gastrojejunal anastomotic ulcer with perforation-General surgery has been consulted, patient is n.p.o.  Started on Rocephin, Flagyl, fluconazole.  Continue IV LR at 125 mill per hour.  Continue as needed Zofran for nausea and vomiting.  Morphine as needed for pain.  Plan for upper GI barium swallow today.  General surgery following. 2. Anemia from GI bleed- Continue Protonix 80 mg IV 12 hours 3. Transaminitis-resolved, patient presented with significant elevated LFTs AST was 250, ALT 124, total bili 1.1.  She has history of [name].  This morning LFTs are significantly improved, AST/ALT is 55/71, total bili 0.6.       COVID-19 Labs  No results for input(s): DDIMER, FERRITIN, LDH, CRP in the last 72 hours.  Lab Results  Component Value Date   SARSCOV2NAA NEGATIVE 03/27/2020   SARSCOV2NAA NEGATIVE 03/21/2020   SARSCOV2NAA Not Detected 09/25/2019   SARSCOV2NAA NEGATIVE 07/09/2019     Scheduled medications:   . bisacodyl  10 mg Rectal Daily         CBG: No results for input(s): GLUCAP in the last 168 hours.  SpO2: 99 %    CBC: Recent Labs  Lab 03/24/20 0525 03/27/20 1728 03/28/20 2311 03/29/20 0432 03/30/20 0401  WBC 7.7 7.0 12.2* 8.9 3.7*   NEUTROABS 6.0  --   --   --   --   HGB 9.0* 10.8* 10.3* 9.2* 9.0*  HCT 30.8* 37.2 34.9* 33.2* 32.1*  MCV 69.4* 72.8* 73.6* 76.0* 76.1*  PLT 370 627* 474* 471* 391    Basic Metabolic Panel: Recent Labs  Lab 03/24/20 0525 03/27/20 1728 03/28/20 2311 03/29/20 0432 03/30/20 0401  NA 137 137 137 138 137  K 3.3* 3.5 3.7 3.8 3.5  CL 105 102 103 104 104  CO2 25 25 25 25 23   GLUCOSE 94 101* 102* 96 58*  BUN <5* 9 7 6 6   CREATININE 0.47 0.57 0.59 0.62 0.73  CALCIUM 8.4* 9.0 8.7* 8.5* 7.9*     Liver Function Tests: Recent Labs  Lab 03/27/20 1728 03/28/20 2311 03/30/20 0401  AST 19 250* 55*  ALT 30 124* 71*  ALKPHOS 139* 228* 161*  BILITOT 0.9 1.1 0.6  PROT 7.8 6.3* 5.3*  ALBUMIN 3.9 3.1* 2.5*     Antibiotics: Anti-infectives (From admission, onward)   Start     Dose/Rate Route Frequency Ordered Stop   03/28/20 2300  fluconazole (DIFLUCAN) IVPB 400 mg        400 mg 100 mL/hr over 120 Minutes Intravenous Every 24 hours 03/28/20 2159     03/28/20 2300  cefTRIAXone (ROCEPHIN) 2 g in sodium chloride 0.9 % 100 mL IVPB        2 g 200 mL/hr over 30 Minutes Intravenous Every 24 hours 03/28/20 2203     03/28/20  2300  metroNIDAZOLE (FLAGYL) IVPB 500 mg        500 mg 100 mL/hr over 60 Minutes Intravenous Every 8 hours 03/28/20 2203         DVT prophylaxis: SCDs  Code Status: Full code  Family Communication: No family at bedside   Consultants:    Procedures:      Objective   Vitals:   03/29/20 1758 03/29/20 2032 03/29/20 2138 03/30/20 0605  BP: 103/64 91/64 (!) 107/59 94/76  Pulse: 66 74 71 73  Resp: 16 18 18 17   Temp: 98.3 F (36.8 C) 98.3 F (36.8 C)  99.3 F (37.4 C)  TempSrc: Oral Oral  Oral  SpO2: 100% 98% 97% 99%  Weight:      Height:        Intake/Output Summary (Last 24 hours) at 03/30/2020 1055 Last data filed at 03/30/2020 0827 Gross per 24 hour  Intake 3454.85 ml  Output 550 ml  Net 2904.85 ml    02/19 1901 - 02/21 0700 In: 4062.8  [I.V.:2562.7] Out: 125 [Urine:125]  Filed Weights   03/29/20 0451  Weight: 78.4 kg    Physical Examination:    General-appears in no acute distress  Heart-S1-S2, regular, no murmur auscultated  Lungs-clear to auscultation bilaterally, no wheezing or crackles auscultated  Abdomen-soft, nontender, no organomegaly  Extremities-no edema in the lower extremities  Neuro-alert, oriented x3, no focal deficit noted   Status is: Inpatient  Dispo: The patient is from: Home              Anticipated d/c is to: Home              Anticipated d/c date is: 04/03/2020              Patient currently not stable for discharge  Barrier to discharge-perforated anastomotic ulcer       Data Reviewed:   Recent Results (from the past 240 hour(s))  SARS CORONAVIRUS 2 (TAT 6-24 HRS) Nasopharyngeal Nasopharyngeal Swab     Status: None   Collection Time: 03/21/20  2:53 PM   Specimen: Nasopharyngeal Swab  Result Value Ref Range Status   SARS Coronavirus 2 NEGATIVE NEGATIVE Final    Comment: (NOTE) SARS-CoV-2 target nucleic acids are NOT DETECTED.  The SARS-CoV-2 RNA is generally detectable in upper and lower respiratory specimens during the acute phase of infection. Negative results do not preclude SARS-CoV-2 infection, do not rule out co-infections with other pathogens, and should not be used as the sole basis for treatment or other patient management decisions. Negative results must be combined with clinical observations, patient history, and epidemiological information. The expected result is Negative.  Fact Sheet for Patients: 05/19/20  Fact Sheet for Healthcare Providers: HairSlick.no  This test is not yet approved or cleared by the [place] FDA and  has been authorized for detection and/or diagnosis of SARS-CoV-2 by FDA under an Emergency Use Authorization (EUA). This EUA will remain  in effect (meaning this  test can be used) for the duration of the COVID-19 declaration under Se ction 564(b)(1) of the Act, 21 U.S.C. section 360bbb-3(b)(1), unless the authorization is terminated or revoked sooner.  Performed at [facility], [address] [place], [place]   Resp Panel by RT-PCR (Flu A&B, Covid) Nasopharyngeal Swab     Status: None   Collection Time: 03/27/20  9:30 PM   Specimen: Nasopharyngeal Swab; Nasopharyngeal(NP) swabs in vial transport medium  Result Value Ref Range Status   SARS  Coronavirus 2 by RT PCR NEGATIVE NEGATIVE Final    Comment: (NOTE) SARS-CoV-2 target nucleic acids are NOT DETECTED.  The SARS-CoV-2 RNA is generally detectable in upper respiratory specimens during the acute phase of infection. The lowest concentration of SARS-CoV-2 viral copies this assay can detect is 138 copies/mL. A negative result does not preclude SARS-Cov-2 infection and should not be used as the sole basis for treatment or other patient management decisions. A negative result may occur with  improper specimen collection/handling, submission of specimen other than nasopharyngeal swab, presence of viral mutation(s) within the areas targeted by this assay, and inadequate number of viral copies(<138 copies/mL). A negative result must be combined with clinical observations, patient history, and epidemiological information. The expected result is Negative.  Fact Sheet for Patients:  BloggerCourse.com  Fact Sheet for Healthcare Providers:  SeriousBroker.it  This test is no t yet approved or cleared by the [place] FDA and  has been authorized for detection and/or diagnosis of SARS-CoV-2 by FDA under an Emergency Use Authorization (EUA). This EUA will remain  in effect (meaning this test can be used) for the duration of the COVID-19 declaration under Section 564(b)(1) of the Act, 21 U.S.C.section 360bbb-3(b)(1), unless the  authorization is terminated  or revoked sooner.       Influenza A by PCR NEGATIVE NEGATIVE Final   Influenza B by PCR NEGATIVE NEGATIVE Final    Comment: (NOTE) The Xpert Xpress SARS-CoV-2/FLU/RSV plus assay is intended as an aid in the diagnosis of influenza from Nasopharyngeal swab specimens and should not be used as a sole basis for treatment. Nasal washings and aspirates are unacceptable for Xpert Xpress SARS-CoV-2/FLU/RSV testing.  Fact Sheet for Patients: BloggerCourse.com  Fact Sheet for Healthcare Providers: SeriousBroker.it  This test is not yet approved or cleared by the [place] FDA and has been authorized for detection and/or diagnosis of SARS-CoV-2 by FDA under an Emergency Use Authorization (EUA). This EUA will remain in effect (meaning this test can be used) for the duration of the COVID-19 declaration under Section 564(b)(1) of the Act, 21 U.S.C. section 360bbb-3(b)(1), unless the authorization is terminated or revoked.  Performed at [facility], [address] [place], [place] [address]     Recent Labs  Lab 03/27/20 1728  LIPASE 26   No results for input(s): AMMONIA in the last 168 hours.  Cardiac Enzymes: No results for input(s): CKTOTAL, CKMB, CKMBINDEX, TROPONINI in the last 168 hours. BNP (last 3 results) No results for input(s): BNP in the last 8760 hours.  ProBNP (last 3 results) No results for input(s): PROBNP in the last 8760 hours.  Studies:  No results found.     Meredeth Ide   [facility] If 7PM-7AM, please contact night-coverage at [email or web address], Office  [phone]   03/30/2020, 10:55 AM  LOS: 2 days

## 2020-03-30 NOTE — Progress Notes (Signed)
Progress Note: General Surgery Service   Chief Complaint/Subjective: Persistent pain, low fever overnight  Objective: Vital signs in last 24 hours: Temp:  [98.3 F (36.8 C)-99.3 F (37.4 C)] 99.3 F (37.4 C) (02/21 0605) Pulse Rate:  [66-74] 73 (02/21 0605) Resp:  [16-18] 17 (02/21 0605) BP: (91-107)/(59-76) 94/76 (02/21 0605) SpO2:  [97 %-100 %] 99 % (02/21 0605) Last BM Date: 03/24/20  Intake/Output from previous day: 02/20 0701 - 02/21 0700 In: 3245.8 [I.V.:2345.6; IV Piggyback:900.1] Out: 0  Intake/Output this shift: Total I/O In: 209.1 [I.V.:202.2; IV Piggyback:6.9] Out: 550 [Urine:550]  Gen: NAD  Resp: nonlabored  Card: RRR  Abd: soft, slight pain on palpation in upper abdomen  Lab Results: CBC  Recent Labs    03/29/20 0432 03/30/20 0401  WBC 8.9 3.7*  HGB 9.2* 9.0*  HCT 33.2* 32.1*  PLT 471* 391   BMET Recent Labs    03/29/20 0432 03/30/20 0401  NA 138 137  K 3.8 3.5  CL 104 104  CO2 25 23  GLUCOSE 96 58*  BUN 6 6  CREATININE 0.62 0.73  CALCIUM 8.5* 7.9*   PT/INR No results for input(s): LABPROT, INR in the last 72 hours. ABG No results for input(s): PHART, HCO3 in the last 72 hours.  Invalid input(s): PCO2, PO2  Anti-infectives: Anti-infectives (From admission, onward)   Start     Dose/Rate Route Frequency Ordered Stop   03/28/20 2300  fluconazole (DIFLUCAN) IVPB 400 mg        400 mg 100 mL/hr over 120 Minutes Intravenous Every 24 hours 03/28/20 2159     03/28/20 2300  cefTRIAXone (ROCEPHIN) 2 g in sodium chloride 0.9 % 100 mL IVPB        2 g 200 mL/hr over 30 Minutes Intravenous Every 24 hours 03/28/20 2203     03/28/20 2300  metroNIDAZOLE (FLAGYL) IVPB 500 mg        500 mg 100 mL/hr over 60 Minutes Intravenous Every 8 hours 03/28/20 2203        Medications: Scheduled Meds: . bisacodyl  10 mg Rectal Daily   Continuous Infusions: . cefTRIAXone (ROCEPHIN)  IV 2 g (03/29/20 2151)  . fluconazole (DIFLUCAN) IV 400 mg (03/29/20  2147)  . lactated ringers 100 mL/hr at 03/30/20 1100  . metronidazole 500 mg (03/30/20 5701)  . pantoprazole (PROTONIX) IV 80 mg (03/30/20 1056)   PRN Meds:.acetaminophen **OR** acetaminophen, morphine injection, ondansetron (ZOFRAN) IV  Assessment/Plan: Marginal ulcer with contained perforation.  -UGI today -continue PPI -continue antibiotics   LOS: 2 days   Rodman Pickle, MD [phone] [facility]

## 2020-03-31 LAB — BASIC METABOLIC PANEL
Anion gap: 10 (ref 5–15)
BUN: <5 mg/dL — ABNORMAL LOW (ref 6–20)
CO2: 26 mmol/L (ref 22–32)
Calcium: 8.4 mg/dL — ABNORMAL LOW (ref 8.9–10.3)
Chloride: 102 mmol/L (ref 98–111)
Creatinine, Ser: 0.64 mg/dL (ref 0.44–1.00)
GFR, Estimated: >60 mL/min (ref >60)
Glucose, Bld: 74 mg/dL (ref 70–99)
Potassium: 3.3 mmol/L — ABNORMAL LOW (ref 3.5–5.1)
Sodium: 138 mmol/L (ref 135–145)

## 2020-03-31 LAB — CBC
HCT: 34.3 % — ABNORMAL LOW (ref 36.0–46.0)
Hemoglobin: 9.7 g/dL — ABNORMAL LOW (ref 12.0–15.0)
MCH: 21.4 pg — ABNORMAL LOW (ref 26.0–34.0)
MCHC: 28.3 g/dL — ABNORMAL LOW (ref 30.0–36.0)
MCV: 75.6 fL — ABNORMAL LOW (ref 80.0–100.0)
Platelets: 427 10*3/uL — ABNORMAL HIGH (ref 150–400)
RBC: 4.54 MIL/uL (ref 3.87–5.11)
RDW: 27.2 % — ABNORMAL HIGH (ref 11.5–15.5)
WBC: 3.9 10*3/uL — ABNORMAL LOW (ref 4.0–10.5)
nRBC: 0 % (ref 0.0–0.2)

## 2020-03-31 MED ORDER — ENSURE MAX PROTEIN PO LIQD
11.0000 [oz_av] | Freq: Three times a day (TID) | ORAL | Status: DC
  Administered 2020-03-31 – 2020-04-01 (×4): 11 [oz_av] via ORAL
  Filled 2020-03-31 (×5): qty 330

## 2020-03-31 MED ORDER — POTASSIUM CHLORIDE CRYS ER 20 MEQ PO TBCR
40.0000 meq | EXTENDED_RELEASE_TABLET | Freq: Once | ORAL | Status: AC
  Administered 2020-03-31: 40 meq via ORAL
  Filled 2020-03-31: qty 2

## 2020-03-31 NOTE — Progress Notes (Signed)
[facility]  PROGRESS NOTE  Melanie Olson SNK:[id number] DOB: 11/25/1970 DOA: 03/28/2020 PCP: [facility]   Brief HPI:    50 year old female with medical history of Roux-en-Y gastric bypass, recent GI bleed admission from 03/21/2020 03/24/2020 due to ulcer at the gastrojejunal anastomosis seen on EGD, iron deficiency anemia, Melanie Olson on imaging presented to the ED with complaints of severe epigastric pain with radiation to right upper quadrant.  In the ED CT scan showed perforated ulcer noted at midline.  General surgery was consulted.   Subjective   Patient seen and examined, upper GI barium swallow done yesterday showed no perforation.  Patient is anxious about recurrence of perforation.   Assessment/Plan:     1. Gastrojejunal anastomotic ulcer with contained perforation-General surgery was consulted, patient was empirically started on Rocephin, Flagyl and fluconazole.  LR at 125 mL/h.  Zofran for nausea and vomiting.  Upper GI barium swallow did not show perforation.  We will follow general surgery recommendations.  2. Anemia from GI bleed- Continue Protonix 80 mg IV 12 hours.  Patient takes Carafate and Protonix at home.  She will be discharged on home medications. 3. Hypokalemia-potassium was 3.3, will replace potassium and follow BMP in am. 4. Transaminitis-resolved, patient presented with significant elevated LFTs AST was 250, ALT 124, total bili 1.1.  She has history of Melanie [name]  This morning LFTs are significantly improved, AST/ALT is 55/71, total bili 0.6.     Lab Results  Component Value Date   SARSCOV2NAA NEGATIVE 03/27/2020   SARSCOV2NAA NEGATIVE 03/21/2020   SARSCOV2NAA Not Detected 09/25/2019   SARSCOV2NAA NEGATIVE 07/09/2019     Scheduled medications:   . bisacodyl  10 mg Rectal Daily  . Ensure Max Protein  11 oz Oral TID WC  . sucralfate  1 g Oral TID WC & HS    SpO2: 97 %    CBC: Recent Labs  Lab 03/27/20 1728 03/28/20 2311  03/29/20 0432 03/30/20 0401 03/31/20 0456  WBC 7.0 12.2* 8.9 3.7* 3.9*  HGB 10.8* 10.3* 9.2* 9.0* 9.7*  HCT 37.2 34.9* 33.2* 32.1* 34.3*  MCV 72.8* 73.6* 76.0* 76.1* 75.6*  PLT 627* 474* 471* 391 427*    Basic Metabolic Panel: Recent Labs  Lab 03/27/20 1728 03/28/20 2311 03/29/20 0432 03/30/20 0401 03/31/20 0456  NA 137 137 138 137 138  K 3.5 3.7 3.8 3.5 3.3*  CL 102 103 104 104 102  CO2 25 25 25 23 26   GLUCOSE 101* 102* 96 58* 74  BUN 9 7 6 6  <5*  CREATININE 0.57 0.59 0.62 0.73 0.64  CALCIUM 9.0 8.7* 8.5* 7.9* 8.4*     Liver Function Tests: Recent Labs  Lab 03/27/20 1728 03/28/20 2311 03/30/20 0401  AST 19 250* 55*  ALT 30 124* 71*  ALKPHOS 139* 228* 161*  BILITOT 0.9 1.1 0.6  PROT 7.8 6.3* 5.3*  ALBUMIN 3.9 3.1* 2.5*     Antibiotics: Anti-infectives (From admission, onward)   Start     Dose/Rate Route Frequency Ordered Stop   03/28/20 2300  fluconazole (DIFLUCAN) IVPB 400 mg        400 mg 100 mL/hr over 120 Minutes Intravenous Every 24 hours 03/28/20 2159     03/28/20 2300  cefTRIAXone (ROCEPHIN) 2 g in sodium chloride 0.9 % 100 mL IVPB        2 g 200 mL/hr over 30 Minutes Intravenous Every 24 hours 03/28/20 2203     03/28/20 2300  metroNIDAZOLE (FLAGYL) IVPB 500 mg  500 mg 100 mL/hr over 60 Minutes Intravenous Every 8 hours 03/28/20 2203         DVT prophylaxis: SCDs  Code Status: Full code  Family Communication: No family at bedside      Objective   Vitals:   03/30/20 1135 03/30/20 1905 03/31/20 0416 03/31/20 1107  BP: 113/61 106/63 (!) 111/57 108/67  Pulse: 66 68 (!) 56 66  Resp: 16 16 16 16   Temp: 98.3 F (36.8 C) 97.8 F (36.6 C) 98 F (36.7 C) 98.4 F (36.9 C)  TempSrc:   Oral   SpO2: 96% 99% 97% 97%  Weight:      Height:        Intake/Output Summary (Last 24 hours) at 03/31/2020 1315 Last data filed at 03/31/2020 1131 Gross per 24 hour  Intake 1106.74 ml  Output 1551 ml  Net -444.26 ml    02/20 1901 - 02/22  0700 In: 3483.6 [P.O.:840; I.V.:1843.4] Out: 1200 [Urine:1200]  Filed Weights   03/29/20 0451  Weight: 78.4 kg    Physical Examination:    General-appears in no acute distress  Heart-S1-S2, regular, no murmur auscultated  Lungs-clear to auscultation bilaterally, no wheezing or crackles auscultated  Abdomen-soft, nontender, no organomegaly  Extremities-no edema in the lower extremities  Neuro-alert, oriented x3, no focal deficit noted   Status is: Inpatient  Dispo: The patient is from: Home              Anticipated d/c is to: Home              Anticipated d/c date is: 04/03/2020              Patient currently not stable for discharge  Barrier to discharge-perforated anastomotic ulcer       Data Reviewed:   Recent Results (from the past 240 hour(s))  SARS CORONAVIRUS 2 (TAT 6-24 HRS) Nasopharyngeal Nasopharyngeal Swab     Status: None   Collection Time: 03/21/20  2:53 PM   Specimen: Nasopharyngeal Swab  Result Value Ref Range Status   SARS Coronavirus 2 NEGATIVE NEGATIVE Final    Comment: (NOTE) SARS-CoV-2 target nucleic acids are NOT DETECTED.  The SARS-CoV-2 RNA is generally detectable in upper and lower respiratory specimens during the acute phase of infection. Negative results do not preclude SARS-CoV-2 infection, do not rule out co-infections with other pathogens, and should not be used as the sole basis for treatment or other patient management decisions. Negative results must be combined with clinical observations, patient history, and epidemiological information. The expected result is Negative.  Fact Sheet for Patients: 05/19/20  Fact Sheet for Healthcare Providers: HairSlick.no  This test is not yet approved or cleared by the quierodirigir.com FDA and  has been authorized for detection and/or diagnosis of SARS-CoV-2 by FDA under an Emergency Use Authorization (EUA). This EUA will remain   in effect (meaning this test can be used) for the duration of the COVID-19 declaration under Se ction 564(b)(1) of the Act, 21 U.S.C. section 360bbb-3(b)(1), unless the authorization is terminated or revoked sooner.  Performed at [facility], [address] [place], [place]   Resp Panel by RT-PCR (Flu A&B, Covid) Nasopharyngeal Swab     Status: None   Collection Time: 03/27/20  9:30 PM   Specimen: Nasopharyngeal Swab; Nasopharyngeal(NP) swabs in vial transport medium  Result Value Ref Range Status   SARS Coronavirus 2 by RT PCR NEGATIVE NEGATIVE Final    Comment: (NOTE) SARS-CoV-2 target nucleic  acids are NOT DETECTED.  The SARS-CoV-2 RNA is generally detectable in upper respiratory specimens during the acute phase of infection. The lowest concentration of SARS-CoV-2 viral copies this assay can detect is 138 copies/mL. A negative result does not preclude SARS-Cov-2 infection and should not be used as the sole basis for treatment or other patient management decisions. A negative result may occur with  improper specimen collection/handling, submission of specimen other than nasopharyngeal swab, presence of viral mutation(s) within the areas targeted by this assay, and inadequate number of viral copies(<138 copies/mL). A negative result must be combined with clinical observations, patient history, and epidemiological information. The expected result is Negative.  Fact Sheet for Patients:  BloggerCourse.comhttps://www.fda.gov/media/152166/download  Fact Sheet for Healthcare Providers:  SeriousBroker.ithttps://www.fda.gov/media/152162/download  This test is no t yet approved or cleared by the [place] FDA and  has been authorized for detection and/or diagnosis of SARS-CoV-2 by FDA under an Emergency Use Authorization (EUA). This EUA will remain  in effect (meaning this test can be used) for the duration of the COVID-19 declaration under Section 564(b)(1) of the Act, 21 U.S.C.section  360bbb-3(b)(1), unless the authorization is terminated  or revoked sooner.       Influenza A by PCR NEGATIVE NEGATIVE Final   Influenza B by PCR NEGATIVE NEGATIVE Final    Comment: (NOTE) The Xpert Xpress SARS-CoV-2/FLU/RSV plus assay is intended as an aid in the diagnosis of influenza from Nasopharyngeal swab specimens and should not be used as a sole basis for treatment. Nasal washings and aspirates are unacceptable for Xpert Xpress SARS-CoV-2/FLU/RSV testing.  Fact Sheet for Patients: BloggerCourse.comhttps://www.fda.gov/media/152166/download  Fact Sheet for Healthcare Providers: SeriousBroker.ithttps://www.fda.gov/media/152162/download  This test is not yet approved or cleared by the [place] FDA and has been authorized for detection and/or diagnosis of SARS-CoV-2 by FDA under an Emergency Use Authorization (EUA). This EUA will remain in effect (meaning this test can be used) for the duration of the COVID-19 declaration under Section 564(b)(1) of the Act, 21 U.S.C. section 360bbb-3(b)(1), unless the authorization is terminated or revoked.  Performed at [facility], [address] [place], [place] [address]     Recent Labs  Lab 03/27/20 1728  LIPASE 26   No results for input(s): AMMONIA in the last 168 hours.  Cardiac Enzymes: No results for input(s): CKTOTAL, CKMB, CKMBINDEX, TROPONINI in the last 168 hours. BNP (last 3 results) No results for input(s): BNP in the last 8760 hours.  ProBNP (last 3 results) No results for input(s): PROBNP in the last 8760 hours.  Studies:  DG UGI W SINGLE CM (SOL OR THIN BA)  Result Date: 03/30/2020 CLINICAL DATA:  Marginal ulcer. EXAM: UPPER GI SERIES WITH KUB TECHNIQUE: After obtaining a scout radiograph a routine upper GI series was performed using thin and high density barium. FLUOROSCOPY TIME:  Fluoroscopy Time:  1 minutes, 30 seconds. Radiation Exposure Index (if provided by the fluoroscopic device): 80 mGy. Number of Acquired Spot Images:  8 COMPARISON:  CT abdomen/pelvis 03/27/2020. FINDINGS: A scout radiograph of the abdomen demonstrates a nonobstructive bowel gas pattern. Cholecystectomy clips within the right upper quadrant of the abdomen. A problem-oriented water-soluble contrast upper GI series was performed to assess for free perforation at site of a known marginal ulcer. Expected post Roux-en-Y anatomy. No intraperitoneal concha is demonstrated to free perforation. A known marginal ulcer with contained perforation was better delineated on the recent prior CT abdomen/pelvis of 03/27/2020. There is brisk passage of contrast from the distal esophagus into the gastric  remnant and subsequently into the Roux limb. Moderate to severe intermittent esophageal dysmotility. IMPRESSION: Problem-oriented water-soluble contrast upper GI series performed to assess for free perforation at site of a known marginal ulcer. No intraperitoneal contrast is demonstrated to suggest free perforation. The known marginal ulcer with contained perforation was better delineated on the recent prior CT abdomen/pelvis of 03/27/2020. Moderate severe intermittent esophageal dysmotility. Electronically Signed   By: Jackey Loge DO   On: 03/30/2020 14:15       Meredeth Ide   [facility] If 7PM-7AM, please contact night-coverage at [email or web address], Office  [phone]   03/31/2020, 1:15 PM  LOS: 3 days

## 2020-03-31 NOTE — Progress Notes (Signed)
Progress Note: General Surgery Service   Chief Complaint/Subjective: Pain much better with addition of carafate.  Tolerating CLD well.  No nausea.  Objective: Vital signs in last 24 hours: Temp:  [97.8 F (36.6 C)-98.3 F (36.8 C)] 98 F (36.7 C) (02/22 0416) Pulse Rate:  [56-68] 56 (02/22 0416) Resp:  [16] 16 (02/22 0416) BP: (106-113)/(57-63) 111/57 (02/22 0416) SpO2:  [96 %-99 %] 97 % (02/22 0416) Last BM Date: 03/24/20  Intake/Output from previous day: 02/21 0701 - 02/22 0700 In: 1795.8 [P.O.:840; I.V.:755.8; IV Piggyback:200.1] Out: 1200 [Urine:1200] Intake/Output this shift: No intake/output data recorded.  PE:  Gen: NAD Heart: regular Lungs: CTAB Abd: soft , minimal epigastric tenderness, +BS  Lab Results: CBC  Recent Labs    03/30/20 0401 03/31/20 0456  WBC 3.7* 3.9*  HGB 9.0* 9.7*  HCT 32.1* 34.3*  PLT 391 427*   BMET Recent Labs    03/30/20 0401 03/31/20 0456  NA 137 138  K 3.5 3.3*  CL 104 102  CO2 23 26  GLUCOSE 58* 74  BUN 6 <5*  CREATININE 0.73 0.64  CALCIUM 7.9* 8.4*   PT/INR No results for input(s): LABPROT, INR in the last 72 hours. ABG No results for input(s): PHART, HCO3 in the last 72 hours.  Invalid input(s): PCO2, PO2  Anti-infectives: Anti-infectives (From admission, onward)   Start     Dose/Rate Route Frequency Ordered Stop   03/28/20 2300  fluconazole (DIFLUCAN) IVPB 400 mg        400 mg 100 mL/hr over 120 Minutes Intravenous Every 24 hours 03/28/20 2159     03/28/20 2300  cefTRIAXone (ROCEPHIN) 2 g in sodium chloride 0.9 % 100 mL IVPB        2 g 200 mL/hr over 30 Minutes Intravenous Every 24 hours 03/28/20 2203     03/28/20 2300  metroNIDAZOLE (FLAGYL) IVPB 500 mg        500 mg 100 mL/hr over 60 Minutes Intravenous Every 8 hours 03/28/20 2203        Medications: Scheduled Meds:  bisacodyl  10 mg Rectal Daily   sucralfate  1 g Oral TID WC & HS   Continuous Infusions:  cefTRIAXone (ROCEPHIN)  IV 2 g  (03/30/20 2156)   fluconazole (DIFLUCAN) IV 400 mg (03/30/20 2215)   lactated ringers 100 mL/hr at 03/30/20 2151   metronidazole 500 mg (03/31/20 0800)   pantoprazole (PROTONIX) IV 80 mg (03/30/20 2329)   PRN Meds:.acetaminophen **OR** acetaminophen, morphine injection, ondansetron (ZOFRAN) IV  Assessment/Plan: Marginal ulcer with contained perforation.  -CLD and add premier shakes TID.  Will go home on this diet for at least a week or so -can transition abx to oral today -cont PPI -can likely Dc home tomorrow if continues to do well.  Will follow up with [place] and GI   LOS: 3 days   Letha Cape, PA-C [phone] [facility]

## 2020-04-01 LAB — CBC WITH DIFFERENTIAL/PLATELET
Abs Immature Granulocytes: 0.03 10*3/uL (ref 0.00–0.07)
Basophils Absolute: 0 10*3/uL (ref 0.0–0.1)
Basophils Relative: 1 %
Eosinophils Absolute: 0.2 10*3/uL (ref 0.0–0.5)
Eosinophils Relative: 4 %
HCT: 37.7 % (ref 36.0–46.0)
Hemoglobin: 10.9 g/dL — ABNORMAL LOW (ref 12.0–15.0)
Immature Granulocytes: 1 %
Lymphocytes Relative: 15 %
Lymphs Abs: 0.6 10*3/uL — ABNORMAL LOW (ref 0.7–4.0)
MCH: 21.7 pg — ABNORMAL LOW (ref 26.0–34.0)
MCHC: 28.9 g/dL — ABNORMAL LOW (ref 30.0–36.0)
MCV: 75.1 fL — ABNORMAL LOW (ref 80.0–100.0)
Monocytes Absolute: 0.4 10*3/uL (ref 0.1–1.0)
Monocytes Relative: 10 %
Neutro Abs: 2.7 10*3/uL (ref 1.7–7.7)
Neutrophils Relative %: 69 %
Platelets: 488 10*3/uL — ABNORMAL HIGH (ref 150–400)
RBC: 5.02 MIL/uL (ref 3.87–5.11)
RDW: 27.8 % — ABNORMAL HIGH (ref 11.5–15.5)
WBC: 3.8 10*3/uL — ABNORMAL LOW (ref 4.0–10.5)
nRBC: 0 % (ref 0.0–0.2)

## 2020-04-01 LAB — BASIC METABOLIC PANEL
Anion gap: 11 (ref 5–15)
BUN: <5 mg/dL — ABNORMAL LOW (ref 6–20)
CO2: 26 mmol/L (ref 22–32)
Calcium: 8.7 mg/dL — ABNORMAL LOW (ref 8.9–10.3)
Chloride: 102 mmol/L (ref 98–111)
Creatinine, Ser: 0.58 mg/dL (ref 0.44–1.00)
GFR, Estimated: >60 mL/min (ref >60)
Glucose, Bld: 98 mg/dL (ref 70–99)
Potassium: 3.9 mmol/L (ref 3.5–5.1)
Sodium: 139 mmol/L (ref 135–145)

## 2020-04-01 MED ORDER — CIPROFLOXACIN HCL 500 MG PO TABS: 500.0000 mg | ORAL_TABLET | Freq: Two times a day (BID) | ORAL | 0 refills | Status: AC

## 2020-04-01 MED ORDER — SUCRALFATE 1 GM/10ML PO SUSP: 1.0000 g | Freq: Three times a day (TID) | ORAL | 1 refills | Status: DC

## 2020-04-01 MED ORDER — PANTOPRAZOLE SODIUM 40 MG PO TBEC: 40.0000 mg | DELAYED_RELEASE_TABLET | Freq: Two times a day (BID) | ORAL | 2 refills | Status: DC

## 2020-04-01 MED ORDER — METRONIDAZOLE 500 MG PO TABS: 500.0000 mg | ORAL_TABLET | Freq: Three times a day (TID) | ORAL | 0 refills | Status: AC

## 2020-04-01 MED ORDER — ACETAMINOPHEN 325 MG PO TABS: 650.0000 mg | ORAL_TABLET | Freq: Four times a day (QID) | ORAL | Status: AC | PRN

## 2020-04-01 NOTE — Plan of Care (Signed)
  Problem: Health Behavior/Discharge Planning: Goal: Ability to manage health-related needs will improve 04/01/2020 1137 by Katrina Stack, RN Outcome: Adequate for Discharge 04/01/2020 0745 by Katrina Stack, RN Outcome: Progressing   Problem: Clinical Measurements: Goal: Ability to maintain clinical measurements within normal limits will improve Outcome: Adequate for Discharge Goal: Will remain free from infection Outcome: Adequate for Discharge Goal: Diagnostic test results will improve 04/01/2020 1137 by Katrina Stack, RN Outcome: Adequate for Discharge 04/01/2020 0745 by Katrina Stack, RN Outcome: Progressing Goal: Cardiovascular complication will be avoided Outcome: Adequate for Discharge   Problem: Nutrition: Goal: Adequate nutrition will be maintained Outcome: Adequate for Discharge   Problem: Coping: Goal: Level of anxiety will decrease 04/01/2020 1137 by Katrina Stack, RN Outcome: Adequate for Discharge 04/01/2020 0745 by Katrina Stack, RN Outcome: Progressing   Problem: Elimination: Goal: Will not experience complications related to bowel motility Outcome: Adequate for Discharge Goal: Will not experience complications related to urinary retention Outcome: Adequate for Discharge   Problem: Pain Managment: Goal: General experience of comfort will improve Outcome: Adequate for Discharge   Problem: Safety: Goal: Ability to remain free from injury will improve Outcome: Adequate for Discharge   Problem: Skin Integrity: Goal: Risk for impaired skin integrity will decrease Outcome: Adequate for Discharge

## 2020-04-01 NOTE — Progress Notes (Signed)
Progress Note: General Surgery Service   Chief Complaint/Subjective: Patient feels so much better.  Tolerating her diet well.  Bariatric coordinator has seen her today.  We will refer her to the bariatric nutritionist as well  Objective: Vital signs in last 24 hours: Temp:  [97.9 F (36.6 C)-98.6 F (37 C)] 97.9 F (36.6 C) (02/23 0959) Pulse Rate:  [57-74] 74 (02/23 0959) Resp:  [16-20] 20 (02/23 0959) BP: (99-126)/(58-73) 118/69 (02/23 0959) SpO2:  [96 %-100 %] 100 % (02/23 0959) Last BM Date: 03/31/20  Intake/Output from previous day: 02/22 0701 - 02/23 0700 In: 840 [P.O.:840] Out: 2201 [Urine:2200; Stool:1] Intake/Output this shift: Total I/O In: 5196.9 [P.O.:360; I.V.:3356; IV Piggyback:1480.8] Out: -   PE:  Gen: NAD Heart: regular Lungs: CTAB Abd: soft , NT, +BS, ND  Lab Results: CBC  Recent Labs    03/31/20 0456 04/01/20 0839  WBC 3.9* 3.8*  HGB 9.7* 10.9*  HCT 34.3* 37.7  PLT 427* 488*   BMET Recent Labs    03/31/20 0456 04/01/20 0408  NA 138 139  K 3.3* 3.9  CL 102 102  CO2 26 26  GLUCOSE 74 98  BUN <5* <5*  CREATININE 0.64 0.58  CALCIUM 8.4* 8.7*   PT/INR No results for input(s): LABPROT, INR in the last 72 hours. ABG No results for input(s): PHART, HCO3 in the last 72 hours.  Invalid input(s): PCO2, PO2  Anti-infectives: Anti-infectives (From admission, onward)   Start     Dose/Rate Route Frequency Ordered Stop   04/01/20 0000  ciprofloxacin (CIPRO) 500 MG tablet        500 mg Oral 2 times daily 04/01/20 1056 04/03/20 2359   04/01/20 0000  metroNIDAZOLE (FLAGYL) 500 MG tablet        500 mg Oral 3 times daily 04/01/20 1056 04/03/20 2359   03/28/20 2300  fluconazole (DIFLUCAN) IVPB 400 mg        400 mg 100 mL/hr over 120 Minutes Intravenous Every 24 hours 03/28/20 2159     03/28/20 2300  cefTRIAXone (ROCEPHIN) 2 g in sodium chloride 0.9 % 100 mL IVPB        2 g 200 mL/hr over 30 Minutes Intravenous Every 24 hours 03/28/20 2203      03/28/20 2300  metroNIDAZOLE (FLAGYL) IVPB 500 mg        500 mg 100 mL/hr over 60 Minutes Intravenous Every 8 hours 03/28/20 2203        Medications: Scheduled Meds: . bisacodyl  10 mg Rectal Daily  . Ensure Max Protein  11 oz Oral TID WC  . sucralfate  1 g Oral TID WC & HS   Continuous Infusions: . cefTRIAXone (ROCEPHIN)  IV Stopped (03/31/20 2157)  . fluconazole (DIFLUCAN) IV Stopped (03/31/20 2339)  . lactated ringers Stopped (04/01/20 0902)  . metronidazole 100 mL/hr at 04/01/20 0940  . pantoprazole (PROTONIX) IV 80 mg (04/01/20 1039)   PRN Meds:.acetaminophen **OR** acetaminophen, morphine injection, ondansetron (ZOFRAN) IV  Assessment/Plan: Marginal ulcer with contained perforation.  -doing great!  Pain is gone -will let her go home on FLD and shakes for 1 week and then she can transition to normal bariatric diet -will set her up for FU with the bariatric nutritionist as well as with Dr. Sheliah Hatch -she has GI follow up already -will give her a couple more days of oral abx at home to complete her course -she will continue her protonix BID and her carafate for now. -d/w primary service.  Patient stable  for DC home.   LOS: 4 days   Letha Cape, PA-C 336 [phone] [facility]

## 2020-04-01 NOTE — Discharge Summary (Addendum)
Physician Discharge Summary  Melanie Olson ZOX:[id number] DOB: [birth year] DOA: 03/28/2020  PCP: [facility]  Admit date: 03/28/2020 Discharge date: 04/01/2020  Admitted From: Home Discharge disposition: Home   Code Status: Full Code  Diet Recommendation: Full liquid diet with protein shakes for a week  Discharge Diagnosis:   Principal Problem:   Perforated ulcer (HCC) Active Problems:   Severe anemia   Ulcer at site of surgical anastomosis following bypass of stomach   NASH (nonalcoholic steatohepatitis)   History of Present Illness / Brief narrative:  Patient is a 50 year old white female with a 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  She has a history of multiple "stomach ulcers" dating back to 2013 since she has been here in [place].  These have all apparently been treated at [facility].  She states that some of these have been bleeding.  She takes BID Protonix.  She denies taking any multivitamin.  She has not had any bariatric or gastroenterology follow-up otherwise.  She states she normally has constipation. 2/12-2/15, patient was hospitalized at [facility] for abdominal pain which was worse than usual pain from constipation. CT scan of abdomen showed constipation. GI did an EGD and found an 8 mm ulcer at the gastrojejunal anastomosis.  She was continued on her twice daily Protonix as well as Carafate.  She was discharged home on 2/15. 2/18, patient returned back to ED at [facility] for worsening abdominal pain and inability to keep anything down. She underwent a repeat CT scan which revealed some increasing stranding and inflammation near the GJ anastomotic line with what appeared to be a possible contained perforation of the ulcer noted on her prior upper endoscopy.  No free fluid or free air was definitively noted.  Patient was afebrile, had mild leukocytosis.  She did  not have any hematemesis or melena.  She was transferred to [facility] for bariatric evaluation. Admitted to hospital service Seen by general surgery. Details as below  Subjective:  Seen and examined this morning.  Pleasant middle-aged Caucasian female.  Sitting up in bed.  On clear liquid diet.  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 -Seen by general surgery.  Empirically started on IV antibiotics and antifungals. -Underwent upper GI barium swallow which did not show any perforation. -Currently on clear liquid diet. -Per GI recommendation, patient can be discharged home to continue full liquid diet and shakes for 1 week and then she can transition to normal bariatric diet. -Few more days of antibiotics to complete the course -Patient will follow up with bariatric nutritionist as well as surgeon Dr. Sheliah Hatch.  She also has a GI follow-up. -She will continue Protonix twice daily and Carafate.  Chronic anemia -Chronic intermittent GI bleeding from the anastomotic ulcer -Hemoglobin was at the lowest on last admission at 6.9 on 2/13.  This hospitalization, hemoglobin level has remained stable. Recent Labs    03/21/20 2119 03/22/20 0312 03/22/20 1553 03/23/20 0434 03/23/20 1013 03/28/20 2311 03/29/20 0432 03/30/20 0401 03/31/20 0456 04/01/20 0839  HGB 7.4* 6.9*   < > 8.1*   < > 10.3* 9.2* 9.0* 9.7* 10.9*  MCV 68.9* 68.7*  --  69.3*   < > 73.6* 76.0* 76.1* 75.6* 75.1*  VITAMINB12 871  --   --   --   --   --   --   --   --   --  TIBC  --  409  --   --   --   --   --   --   --   --   IRON  --  22*  --   --   --   --   --   --   --   --   RETICCTPCT  --   --   --  1.3  --   --   --   --   --   --    < > = values in this interval not displayed.   Elevated liver enzymes History of NASH status post bariatric surgery -Liver enzymes improved gradually. -Continue to  follow-up with GI as an outpatient Recent Labs  Lab 03/27/20 1728 03/28/20 2311 03/30/20 0401  AST 19 250* 55*  ALT 30 124* 71*  ALKPHOS 139* 228* 161*  BILITOT 0.9 1.1 0.6  PROT 7.8 6.3* 5.3*  ALBUMIN 3.9 3.1* 2.5*   Stable for discharge to home today  Wound care:    Discharge Exam:   Vitals:   03/31/20 1655 03/31/20 2145 04/01/20 0428 04/01/20 0959  BP: 126/67 112/73 (!) 99/58 118/69  Pulse: 64 63 (!) 57 74  Resp: 17  16 20   Temp: 98.3 F (36.8 C) 97.9 F (36.6 C) 98.6 F (37 C) 97.9 F (36.6 C)  TempSrc:   Oral   SpO2: 98% 96% 97% 100%  Weight:      Height:        Body mass index is 31.61 kg/m.  General exam: Pleasant, middle-aged Caucasian female.  Sitting up in bed.  Not in distress. Skin: No rashes, lesions or ulcers. HEENT: Atraumatic, normocephalic, no obvious bleeding Lungs: Clear to auscultation bilaterally CVS: Regular rate and rhythm, no murmur GI/Abd soft, nontender, nondistended, bowel sounds present CNS: Alert, awake, oriented x3 Psychiatry: Mood appropriate Extremities: No pedal edema, no calf tenderness  Follow ups:   Discharge Instructions    Diet full liquid   Complete by: As directed    For 1 week   Increase activity slowly   Complete by: As directed       Follow-up Information    Kinsinger, , MD Follow up in 3 week(s).   Specialty: General Surgery Why: our office should call you to give you this appointment date and time Contact information: [address] [place] [phone]        gastroeneterlogy Follow up.   Why: as already scheduled       Center, [phone] [facility] Follow up.   Specialty: General Practice Contact information: [address] [place] [phone] [place] [phone]               Recommendations for Outpatient Follow-Up:   1. Follow-up with PCP, GI, general surgery as an outpatient.    Discharge Instructions:  Follow with Primary  MD Center, [phone] [facility] in 7 days   Get CBC/BMP checked in next visit within 1 week by PCP or SNF MD ( we routinely change or add medications that can affect your baseline labs and fluid status, therefore we recommend that you get the mentioned basic workup next visit with your PCP, your PCP may decide not to get them or add new tests based on their clinical decision)  On your next visit with your PCP, please Get Medicines reviewed and adjusted.  Please request your PCP  to go over all Hospital Tests and Procedure/Radiological  results at the follow up, please get all Hospital records sent to your Prim MD by signing hospital release before you go home.  Activity: As tolerated with Full fall precautions use walker/cane & assistance as needed  For Heart failure patients - Check your Weight same time everyday, if you gain over 2 pounds, or you develop in leg swelling, experience more shortness of breath or chest pain, call your Primary MD immediately. Follow Cardiac Low Salt Diet and 1.5 lit/day fluid restriction.  If you have smoked or chewed Tobacco in the last 2 yrs please stop smoking, stop any regular Alcohol  and or any Recreational drug use.  If you experience worsening of your admission symptoms, develop shortness of breath, life threatening emergency, suicidal or homicidal thoughts you must seek medical attention immediately by calling 911 or calling your MD immediately  if symptoms less severe.  You Must read complete instructions/literature along with all the possible adverse reactions/side effects for all the Medicines you take and that have been prescribed to you. Take any new Medicines after you have completely understood and accpet all the possible adverse reactions/side effects.   Do not drive, operate heavy machinery, perform activities at heights, swimming or participation in water activities or provide baby sitting services if your were admitted for syncope or  siezures until you have seen by Primary MD or a Neurologist and advised to do so again.  Do not drive when taking Pain medications.  Do not take more than prescribed Pain, Sleep and Anxiety Medications  Wear Seat belts while driving.   Please note You were cared for by a hospitalist during your hospital stay. If you have any questions about your discharge medications or the care you received while you were in the hospital after you are discharged, you can call the unit and asked to speak with the hospitalist on call if the hospitalist that took care of you is not available. Once you are discharged, your primary care physician will handle any further medical issues. Please note that NO REFILLS for any discharge medications will be authorized once you are discharged, as it is imperative that you return to your primary care physician (or establish a relationship with a primary care physician if you do not have one) for your aftercare needs so that they can reassess your need for medications and monitor your lab values.    Allergies as of 04/01/2020      Reactions   Penicillins Rash      Medication List    STOP taking these medications   acetaminophen 650 MG CR tablet Commonly known as: TYLENOL Replaced by: acetaminophen 325 MG tablet     TAKE these medications   acetaminophen 325 MG tablet Commonly known as: TYLENOL Take 2 tablets (650 mg total) by mouth every 6 (six) hours as needed for mild pain (or Fever >/= 101). Replaces: acetaminophen 650 MG CR tablet   acyclovir 200 MG capsule Commonly known as: ZOVIRAX Take 2 capsules by mouth in the morning and at bedtime.   amitriptyline 25 MG tablet Commonly known as: ELAVIL Take 25 mg by mouth at bedtime.   buPROPion 150 MG 24 hr tablet Commonly known as: WELLBUTRIN XL Take 150 mg by mouth daily.   ciprofloxacin 500 MG tablet Commonly known as: Cipro Take 1 tablet (500 mg total) by mouth 2 (two) times daily for 2 days.    cyclobenzaprine 10 MG tablet Commonly known as: FLEXERIL Take 10 mg by mouth at bedtime as  needed for muscle spasms.   metroNIDAZOLE 500 MG tablet Commonly known as: Flagyl Take 1 tablet (500 mg total) by mouth 3 (three) times daily for 2 days.   pantoprazole 40 MG tablet Commonly known as: Protonix Take 1 tablet (40 mg total) by mouth 2 (two) times daily for 180 doses.   sucralfate 1 GM/10ML suspension Commonly known as: CARAFATE Take 10 mLs (1 g total) by mouth 4 (four) times daily -  with meals and at bedtime. What changed: when to take this       Time coordinating discharge: 35 minutes  The results of significant diagnostics from this hospitalization (including imaging, microbiology, ancillary and laboratory) are listed below for reference.    Procedures and Diagnostic Studies:   No results found.   Labs:   Basic Metabolic Panel: Recent Labs  Lab 03/28/20 2311 03/29/20 0432 03/30/20 0401 03/31/20 0456 04/01/20 0408  NA 137 138 137 138 139  K 3.7 3.8 3.5 3.3* 3.9  CL 103 104 104 102 102  CO2 25 25 23 26 26   GLUCOSE 102* 96 58* 74 98  BUN 7 6 6  <5* <5*  CREATININE 0.59 0.62 0.73 0.64 0.58  CALCIUM 8.7* 8.5* 7.9* 8.4* 8.7*   GFR Estimated Creatinine Clearance: 82.5 mL/min (by C-G formula based on SCr of 0.58 mg/dL). Liver Function Tests: Recent Labs  Lab 03/27/20 1728 03/28/20 2311 03/30/20 0401  AST 19 250* 55*  ALT 30 124* 71*  ALKPHOS 139* 228* 161*  BILITOT 0.9 1.1 0.6  PROT 7.8 6.3* 5.3*  ALBUMIN 3.9 3.1* 2.5*   Recent Labs  Lab 03/27/20 1728  LIPASE 26   No results for input(s): AMMONIA in the last 168 hours. Coagulation profile No results for input(s): INR, PROTIME in the last 168 hours.  CBC: Recent Labs  Lab 03/28/20 2311 03/29/20 0432 03/30/20 0401 03/31/20 0456 04/01/20 0839  WBC 12.2* 8.9 3.7* 3.9* 3.8*  NEUTROABS  --   --   --   --  2.7  HGB 10.3* 9.2* 9.0* 9.7* 10.9*  HCT 34.9* 33.2* 32.1* 34.3* 37.7  MCV 73.6* 76.0*  76.1* 75.6* 75.1*  PLT 474* 471* 391 427* 488*   Cardiac Enzymes: No results for input(s): CKTOTAL, CKMB, CKMBINDEX, TROPONINI in the last 168 hours. BNP: Invalid input(s): POCBNP CBG: No results for input(s): GLUCAP in the last 168 hours. D-Dimer No results for input(s): DDIMER in the last 72 hours. Hgb A1c No results for input(s): HGBA1C in the last 72 hours. Lipid Profile No results for input(s): CHOL, HDL, LDLCALC, TRIG, CHOLHDL, LDLDIRECT in the last 72 hours. Thyroid function studies No results for input(s): TSH, T4TOTAL, T3FREE, THYROIDAB in the last 72 hours.  Invalid input(s): FREET3 Anemia work up No results for input(s): VITAMINB12, FOLATE, FERRITIN, TIBC, IRON, RETICCTPCT in the last 72 hours. Microbiology Recent Results (from the past 240 hour(s))  Resp Panel by RT-PCR (Flu A&B, Covid) Nasopharyngeal Swab     Status: None   Collection Time: 03/27/20  9:30 PM   Specimen: Nasopharyngeal Swab; Nasopharyngeal(NP) swabs in vial transport medium  Result Value Ref Range Status   SARS Coronavirus 2 by RT PCR NEGATIVE NEGATIVE Final    Comment: (NOTE) SARS-CoV-2 target nucleic acids are NOT DETECTED.  The SARS-CoV-2 RNA is generally detectable in upper respiratory specimens during the acute phase of infection. The lowest concentration of SARS-CoV-2 viral copies this assay can detect is 138 copies/mL. A negative result does not preclude SARS-Cov-2 infection and should not be used as  the sole basis for treatment or other patient management decisions. A negative result may occur with  improper specimen collection/handling, submission of specimen other than nasopharyngeal swab, presence of viral mutation(s) within the areas targeted by this assay, and inadequate number of viral copies(<138 copies/mL). A negative result must be combined with clinical observations, patient history, and epidemiological information. The expected result is Negative.  Fact Sheet for Patients:   BloggerCourse.com  Fact Sheet for Healthcare Providers:  SeriousBroker.it  This test is no t yet approved or cleared by the [place] FDA and  has been authorized for detection and/or diagnosis of SARS-CoV-2 by FDA under an Emergency Use Authorization (EUA). This EUA will remain  in effect (meaning this test can be used) for the duration of the COVID-19 declaration under Section 564(b)(1) of the Act, 21 U.S.C.section 360bbb-3(b)(1), unless the authorization is terminated  or revoked sooner.       Influenza A by PCR NEGATIVE NEGATIVE Final   Influenza B by PCR NEGATIVE NEGATIVE Final    Comment: (NOTE) The Xpert Xpress SARS-CoV-2/FLU/RSV plus assay is intended as an aid in the diagnosis of influenza from Nasopharyngeal swab specimens and should not be used as a sole basis for treatment. Nasal washings and aspirates are unacceptable for Xpert Xpress SARS-CoV-2/FLU/RSV testing.  Fact Sheet for Patients: BloggerCourse.com  Fact Sheet for Healthcare Providers: SeriousBroker.it  This test is not yet approved or cleared by the [place] FDA and has been authorized for detection and/or diagnosis of SARS-CoV-2 by FDA under an Emergency Use Authorization (EUA). This EUA will remain in effect (meaning this test can be used) for the duration of the COVID-19 declaration under Section 564(b)(1) of the Act, 21 U.S.C. section 360bbb-3(b)(1), unless the authorization is terminated or revoked.  Performed at [facility], [address] [place] [place], [place] [address]      Signed: Lorin Glass  [facility] 04/01/2020, 1:01 PM

## 2020-04-01 NOTE — Discharge Instructions (Signed)
Full liquids and Ensure for 1 week and then you can transition back to a normal bariatric diet  Bariatric full liquid diet suggestions During this period, you need to eat three very small meals a day. Initially 1 to 2 tablespoons of food may be all your body is able to tolerate. Stop if you feel a full pressure. Do not take an extra bite. Take your time and eat slowly. Walking can help food move out of stomach pouch. Overall goal is to get 100 grams of protein and 100 grams carbohydrate each day, but you are doing well to get over 50-60gms of protein and carbs at this stage. Drinking 3 cups double milk each day provides 48 grams protein and 72 grams carbohydrate. These can be consumed hot, cold or flavored in a variety of ways. If you feel gassy, bloated or have loose stools on this diet, it may be lactose intolerance. This is generally temporary. Take Lactase enzyme tablets (such as Lactaid) or eat yogurt or try soy milk or lactose-free milk to alleviate symptoms. Constipation is not uncommon on this diet. Add Benefiber, FiberSure or Metamucil-Smooth to each double milk to promote regularity. Keep walking to promote regularity, as well. Walk at least 30 minutes/day. If you lift weights, you need to lift less than 10 lbs and avoid any abdominal exercises.  Vitamins suggested post-bariatric surgery Chewable calcium citrate (600mg ) with Vitamin D three times a day (morning noon and night) Chewable complete multivitamin once in morning (add one at night if you've had gastric bypass (RNY) surgery) Vitamin B12, 500 mg tablet once a day Vitamin B-complex tablet once a day Liquid iron once a day (do not take at the same time as calcium) Vitamin D3 1000 IU once a day  Recommendations: Protein shakes (sugar-free and with water) Sugar-free pudding Yoghurt Soup with soft noodles Creamed soup Sugar-free sorbet Watery oatmeal/porridge Natural juice Sugar-free, non-fat ice cream

## 2020-04-01 NOTE — Progress Notes (Signed)
DISCHARGE NOTE HOME Melanie Olson to be discharged Home per MD order. Discussed prescriptions and follow up appointments with the patient. Prescriptions given to patient; medication list explained in detail. Patient verbalized understanding.  Skin clean, dry and intact without evidence of skin break down, no evidence of skin tears noted. IV catheter discontinued intact. Site without signs and symptoms of complications. Dressing and pressure applied. Pt denies pain at the site currently. No complaints noted.  Patient free of lines, drains, and wounds.   An After Visit Summary (AVS) was printed and given to the patient. Patient ambulated with fiancee to the lobby, discharged home via private auto.  Katrina Stack, RN

## 2020-04-01 NOTE — Progress Notes (Signed)
Visited with patient today per Dr. Guerry Minors request.  Patient is a former bariatric patient of another facility and had questions about diet, nutrition, and supplements once discharged.  Addressed questions and concerns with the patient.  Gave patient some information about diet, supplements, and nutrition.    Recommend patient to be referred to Outpatient Diabetes and Nutrition Services.  Will f/u with Dr. Sheliah Hatch.

## 2020-04-01 NOTE — Plan of Care (Signed)
  Problem: Health Behavior/Discharge Planning: Goal: Ability to manage health-related needs will improve Outcome: Progressing   Problem: Clinical Measurements: Goal: Diagnostic test results will improve Outcome: Progressing   Problem: Coping: Goal: Level of anxiety will decrease Outcome: Progressing   

## 2020-04-08 ENCOUNTER — Inpatient Hospital Stay: Payer: Self-pay | Attending: Oncology | Admitting: Oncology

## 2020-04-08 ENCOUNTER — Encounter: Payer: Self-pay | Admitting: Oncology

## 2020-04-08 VITALS — BP 111/75 | HR 81 | Temp 99.1°F | Wt 180.2 lb

## 2020-04-08 DIAGNOSIS — D509 Iron deficiency anemia, unspecified: Secondary | ICD-10-CM

## 2020-04-08 DIAGNOSIS — Z9884 Bariatric surgery status: Secondary | ICD-10-CM

## 2020-04-08 DIAGNOSIS — R5383 Other fatigue: Secondary | ICD-10-CM | POA: Insufficient documentation

## 2020-04-08 DIAGNOSIS — K922 Gastrointestinal hemorrhage, unspecified: Secondary | ICD-10-CM | POA: Insufficient documentation

## 2020-04-08 DIAGNOSIS — K7460 Unspecified cirrhosis of liver: Secondary | ICD-10-CM | POA: Insufficient documentation

## 2020-04-08 DIAGNOSIS — Z79899 Other long term (current) drug therapy: Secondary | ICD-10-CM | POA: Insufficient documentation

## 2020-04-08 DIAGNOSIS — D508 Other iron deficiency anemias: Secondary | ICD-10-CM

## 2020-04-08 HISTORY — DX: Iron deficiency anemia, unspecified: D50.9

## 2020-04-08 NOTE — Progress Notes (Signed)
Hematology/Oncology Consult note [facility] Telephone:([phone] [phone]   Patient Care Team: Center, [facility] as PCP - General (General Practice)  REFERRING PROVIDER: [facility], [facility] CHIEF COMPLAINTS/REASON FOR VISIT:  Evaluation of iron deficiency anemia  HISTORY OF PRESENTING ILLNESS:  Melanie Olson is a  50 y.o.  female with PMH listed below was seen in consultation at the request of [facility]   for evaluation of iron deficiency anemia.   03/21/2020-03/24/2020 patient was recently admitted due to abdominal pain.   Patient has been seen by gastroenterology and EGD showed 8 mm ulcer at the gastrojejunal anastomosis.  Patient was recommended to continue Protonix twice daily as well as Carafate. Patient was discharged and she returned to emergency room on 03/27/2020 due to worsening of abdominal pain. She underwent a repeat CT scan which revealed some increasing stranding and inflammation near the GJ anastomotic line with what appeared to be a possible contained perforation of the ulcer noted on her prior upper endoscopy. No free fluid or free air was definitively noted.  Patient was treated with empiric IV antibiotics and antifungals.  Underwent upper GI barium swallow which did not show any perforation. Patient was also found to have anemia with iron deficiency. 03/21/2020, hemoglobin 6.7.  Status post PRBC transfusions in the hospital.  Patient was referred to establish care with hematology for treatment of iron deficiency anemia. Patient denies any abdominal pain today.  Since discharge, she has felt improvement of her fatigue.  Denies any black or bloody bowel movement.  Reports that menses is heavy sometimes.   Review of Systems  Constitutional: Positive for fatigue. Negative for appetite change, chills and fever.  HENT:   Negative for hearing loss and voice change.   Eyes: Negative for eye  problems.  Respiratory: Negative for chest tightness and cough.   Cardiovascular: Negative for chest pain.  Gastrointestinal: Negative for abdominal distention, abdominal pain and blood in stool.  Endocrine: Negative for hot flashes.  Genitourinary: Negative for difficulty urinating and frequency.   Musculoskeletal: Negative for arthralgias.  Skin: Negative for itching and rash.  Neurological: Negative for extremity weakness.  Hematological: Negative for adenopathy.  Psychiatric/Behavioral: Negative for confusion.    MEDICAL HISTORY:  Past Medical History:  Diagnosis Date  . Abdominal wall ulcer (HCC)   . Depression   . GERD (gastroesophageal reflux disease)   . HSV (herpes simplex virus) infection   . IDA (iron deficiency anemia) 04/08/2020  . Migraine     SURGICAL HISTORY: Past Surgical History:  Procedure Laterality Date  . CHOLECYSTECTOMY    . COLONOSCOPY WITH PROPOFOL N/A 07/11/2019   Procedure: COLONOSCOPY WITH PROPOFOL;  Surgeon: Pasty Spillers, MD;  Location: [facility] ENDOSCOPY;  Service: Endoscopy;  Laterality: N/A;  . ESOPHAGOGASTRODUODENOSCOPY (EGD) WITH PROPOFOL N/A 07/11/2019   Procedure: ESOPHAGOGASTRODUODENOSCOPY (EGD) WITH PROPOFOL;  Surgeon: Pasty Spillers, MD;  Location: [facility] ENDOSCOPY;  Service: Endoscopy;  Laterality: N/A;  . ESOPHAGOGASTRODUODENOSCOPY (EGD) WITH PROPOFOL N/A 03/23/2020   Procedure: ESOPHAGOGASTRODUODENOSCOPY (EGD) WITH PROPOFOL;  Surgeon: Pasty Spillers, MD;  Location: [facility] ENDOSCOPY;  Service: Endoscopy;  Laterality: N/A;  . GASTRIC BYPASS  2006  . tummy tuck      SOCIAL HISTORY: Social History   Socioeconomic History  . Marital status: Significant Other    Spouse name: Not on file  . Number of children: 2  . Years of education: Not on file  . Highest education level: Not on file  Occupational History  . Not on file  Tobacco Use  . Smoking status: Former Smoker    Years: 5.00    Types: Cigarettes    Quit date: 04/08/2008     Years since quitting: 12.0  . Smokeless tobacco: Never Used  . Tobacco comment: social smoking on and off for 5 years   Vaping Use  . Vaping Use: Never used  Substance and Sexual Activity  . Alcohol use: No  . Drug use: No  . Sexual activity: Not on file  Other Topics Concern  . Not on file  Social History Narrative  . Not on file   Social Determinants of Health   Financial Resource Strain: Not on file  Food Insecurity: Not on file  Transportation Needs: Not on file  Physical Activity: Not on file  Stress: Not on file  Social Connections: Not on file  Intimate Partner Violence: Not on file    FAMILY HISTORY: Family History  Problem Relation Age of Onset  . High Cholesterol Mother   . Stroke Father   . Aneurysm Father   . Diabetes Maternal Grandmother   . Heart disease Paternal Grandmother     ALLERGIES:  is allergic to penicillins.  MEDICATIONS:  Current Outpatient Medications  Medication Sig Dispense Refill  . acetaminophen (TYLENOL) 325 MG tablet Take 2 tablets (650 mg total) by mouth every 6 (six) hours as needed for mild pain (or Fever >/= 101).    Marland Kitchen acyclovir (ZOVIRAX) 200 MG capsule Take 2 capsules by mouth in the morning and at bedtime.    Marland Kitchen amitriptyline (ELAVIL) 25 MG tablet Take 25 mg by mouth at bedtime.    Marland Kitchen buPROPion (WELLBUTRIN XL) 150 MG 24 hr tablet Take 150 mg by mouth daily.    . cyclobenzaprine (FLEXERIL) 10 MG tablet Take 10 mg by mouth at bedtime as needed for muscle spasms.    . pantoprazole (PROTONIX) 40 MG tablet Take 1 tablet (40 mg total) by mouth 2 (two) times daily for 180 doses. 60 tablet 2  . sucralfate (CARAFATE) 1 GM/10ML suspension Take 10 mLs (1 g total) by mouth 4 (four) times daily -  with meals and at bedtime. 420 mL 1   No current facility-administered medications for this visit.     PHYSICAL EXAMINATION: ECOG PERFORMANCE STATUS: 1 - Symptomatic but completely ambulatory Vitals:   04/08/20 1508  BP: 111/75  Pulse: 81   Temp: 99.1 F (37.3 C)  SpO2: 98%   Filed Weights   04/08/20 1508  Weight: 180 lb 4 oz (81.8 kg)    Physical Exam Constitutional:      General: She is not in acute distress. HENT:     Head: Normocephalic and atraumatic.  Eyes:     General: No scleral icterus. Cardiovascular:     Rate and Rhythm: Normal rate and regular rhythm.     Heart sounds: Normal heart sounds.  Pulmonary:     Effort: Pulmonary effort is normal. No respiratory distress.     Breath sounds: No wheezing.  Abdominal:     General: Bowel sounds are normal. There is no distension.     Palpations: Abdomen is soft.  Musculoskeletal:        General: No deformity. Normal range of motion.     Cervical back: Normal range of motion and neck supple.  Skin:    General: Skin is warm and dry.     Findings: No erythema or rash.  Neurological:     Mental  Status: She is alert and oriented to person, place, and time. Mental status is at baseline.     Cranial Nerves: No cranial nerve deficit.     Coordination: Coordination normal.  Psychiatric:        Mood and Affect: Mood normal.       CMP Latest Ref Rng & Units 04/01/2020  Glucose 70 - 99 mg/dL 98  BUN 6 - 20 mg/dL <2(Z)  Creatinine 3.08 - 1.00 mg/dL 6.57  Sodium 846 - 962 mmol/L 139  Potassium 3.5 - 5.1 mmol/L 3.9  Chloride 98 - 111 mmol/L 102  CO2 22 - 32 mmol/L 26  Calcium 8.9 - 10.3 mg/dL 9.5(M)  Total Protein 6.5 - 8.1 g/dL -  Total Bilirubin 0.3 - 1.2 mg/dL -  Alkaline Phos 38 - 841 U/L -  AST 15 - 41 U/L -  ALT 0 - 44 U/L -   CBC Latest Ref Rng & Units 04/01/2020  WBC 4.0 - 10.5 K/uL 3.8(L)  Hemoglobin 12.0 - 15.0 g/dL 10.9(L)  Hematocrit 36.0 - 46.0 % 37.7  Platelets 150 - 400 K/uL 488(H)     LABORATORY DATA:  I have reviewed the data as listed Lab Results  Component Value Date   WBC 3.8 (L) 04/01/2020   HGB 10.9 (L) 04/01/2020   HCT 37.7 04/01/2020   MCV 75.1 (L) 04/01/2020   PLT 488 (H) 04/01/2020   Recent Labs    05/30/19 1424  06/01/19 1448 03/21/20 1130 03/23/20 0434 03/24/20 0525 03/27/20 1728 03/28/20 2311 03/29/20 0432 03/30/20 0401 03/31/20 0456 04/01/20 0408  NA 137 138   < > 140   < > 137 137   < > 137 138 139  K 3.8 3.6   < > 3.4*   < > 3.5 3.7   < > 3.5 3.3* 3.9  CL 104 105   < > 106   < > 102 103   < > 104 102 102  CO2 28 25   < > 25   < > 25 25   < > GLUCOSE 105* 136*   < > 90   < > 101* 102*   < > 58* 74 98  BUN 10 11   < > 6   < > 9 7   < > 6 <5* <5*  CREATININE 0.45 0.62   < > 0.54   < > 0.57 0.59   < > 0.73 0.64 0.58  CALCIUM 8.9 8.3*   < > 8.1*   < > 9.0 8.7*   < > 7.9* 8.4* 8.7*  GFRNONAA >60 >60   < > >60   < > >60 >60   < > >60 >60 >60  GFRAA >60 >60  --   --   --   --   --   --   --   --   --   PROT 7.1 6.5   < > 5.8*  --  7.8 6.3*  --  5.3*  --   --   ALBUMIN 3.8 3.3*   < > 2.9*  --  3.9 3.1*  --  2.5*  --   --   AST 22 27   < > 40  --  19 250*  --  55*  --   --   ALT 28 28   < > 58*  --  30 124*  --  71*  --   --   ALKPHOS 96 88   < >  155*  --  139* 228*  --  161*  --   --   BILITOT 0.4 0.7   < > 0.8  --  0.9 1.1  --  0.6  --   --   BILIDIR  --   --   --  0.3*  --   --   --   --   --   --   --   IBILI  --   --   --  0.5  --   --   --   --   --   --   --    < > = values in this interval not displayed.   Iron/TIBC/Ferritin/ %Sat    Component Value Date/Time   IRON 22 (L) 03/22/2020 0312   TIBC 409 03/22/2020 0312   IRONPCTSAT 5 (L) 03/22/2020 1610     RADIOGRAPHIC STUDIES: I have personally reviewed the radiological images as listed and agreed with the findings in the report. CT Abdomen Pelvis W Contrast  Result Date: 03/27/2020 CLINICAL DATA:  Acute, nonlocalized abdominal pain, recent discharge on Tuesday, ulcer at the anastomosis from gastric bypass, anemia requiring transfusion EXAM: CT ABDOMEN AND PELVIS WITH CONTRAST TECHNIQUE: Multidetector CT imaging of the abdomen and pelvis was performed using the standard protocol following bolus administration of  intravenous contrast. CONTRAST:  OMNIPAQUE IOHEXOL 300 MG/ML  SOLN COMPARISON:  CT 03/21/2020 FINDINGS: Lower chest: Lung bases are clear. Normal heart size. No pericardial effusion. Hepatobiliary: Diffuse hepatic hypoattenuation compatible with hepatic steatosis. Some more focal infiltration also noted near the falciform ligament. Smooth liver surface contour. No concerning focal liver lesion. Prior cholecystectomy. Slight prominence of the biliary tree is similar to prior. No visible calcified gallstone. Pancreas: No pancreatic ductal dilatation or surrounding inflammatory changes. Spleen: Normal in size. No concerning splenic lesions. Adrenals/Urinary Tract: Normal adrenal glands. Kidneys are normally located with symmetric enhancementand excretion with bilateral extrarenal pelves. Small subcentimeter hypoattenuating focus in the right kidney (7/10) too small to fully characterize on CT imaging but statistically likely benign. Bilateral parapelvic cysts are unchanged prior. No suspicious renal lesion, urolithiasis or hydronephrosis. Urinary bladder is largely decompressed at the time of exam and therefore poorly evaluated by CT imaging. 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 There is some increasing stranding noted adjacent the anastomotic line with what appears to be a contained, perforated ulcer (2/22, 5/35). No frank spillage succus nor free air with peritoneum. Distal anastomosis appears patent. No obstructive changes are evident. No other small bowel thickening or dilatation. No colonic dilatation or wall thickening. A normal appendix is visualized. Vascular/Lymphatic: No significant vascular findings are present. No enlarged abdominal or pelvic lymph nodes. Reproductive: Anterior left fundal fibroid, similar to prior with slight distortion of the fundal endometrium. Simple appearing fluid attenuation cysts in the right adnexa. No  concerning adnexal lesions. Other: Stranding and inflammation centered upon the contained perforation of a marginal ulcer near the gastrojejunal anastomosis in the upper abdomen. No frank spillage of succus or free air. Musculoskeletal: Multilevel degenerative changes are present in the imaged portions of the spine. No acute osseous abnormality or suspicious osseous lesion. IMPRESSION: 1. 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 Increasing conspicuity from the recent comparison. No frank spillage of succus or free air  with peritoneum. 2. Hepatic steatosis. 3. Fibroid uterus. These results were called by telephone at the time of interpretation on 03/27/2020 at 8:41 pm to [facility] , who verbally acknowledged these results. Electronically Signed   By: Kreg ShropshirePrice  DeHay M.D.   On: 03/27/2020 20:42   CT Abdomen Pelvis W Contrast  Result Date: 03/21/2020 CLINICAL DATA:  Abdominal pain EXAM: CT ABDOMEN AND PELVIS WITH CONTRAST TECHNIQUE: Multidetector CT imaging of the abdomen and pelvis was performed using the standard protocol following bolus administration of intravenous contrast. Oral contrast was also administered. CONTRAST:  100mL OMNIPAQUE IOHEXOL 300 MG/ML  SOLN COMPARISON:  June 01, 2019 FINDINGS: Lower chest: Lung bases are clear. There is a stable right epicardial cyst measuring 2.6 x 1.3 cm. Hepatobiliary: There is hepatic steatosis. Beyond hepatic steatosis, no focal liver lesions are appreciable. Gallbladder is absent. The common hepatic duct remains prominent at 10 mm, stable. No biliary duct mass or calculus evident by CT. Pancreas: No pancreatic mass or inflammatory focus evident. Spleen: No splenic lesions are evident. Adrenals/Urinary Tract: Adrenals bilaterally appear normal.  There is a cyst measuring 8 mm in the lateral mid right kidney. There are parapelvic cysts on the right, largest measuring 2.5 x 1.5 cm. There is a 1 x 1 cm parapelvic cyst on the left. There is a cyst medial to the renal pelvis in the mid left kidney measuring 7 x 7 mm. There is an extrarenal pelvis on each side, an anatomic variant. There is no hydronephrosis on either side. There is no evident renal or ureteral calculus on either side. Urinary bladder is midline with wall thickness within normal limits. Stomach/Bowel: Patient is status post gastric bypass procedure. No wall thickening or fluid in perioperative regions noted. There is diffuse stool throughout much of the colon. There is no appreciable diverticular disease. No appreciable bowel wall or mesenteric thickening evident. There is no appreciable bowel obstruction. The terminal ileum appears normal. There is no appreciable free air or portal venous air. Vascular/Lymphatic: There is no abdominal aortic aneurysm. No arterial vascular lesions are evident. Major venous structures appear patent. There is no evident adenopathy in the abdomen or pelvis. Scattered subcentimeter mesenteric lymph nodes are considered nonspecific. Reproductive: Uterus is anteverted. There is a decreased attenuation mass in the volar aspect of the uterus measuring 3.6 x 3.6 cm, an apparent leiomyoma. This apparent leiomyoma displaces the endometrium slightly dorsally. No adnexal masses identified beyond physiologic follicles. Intrauterine device no longer present. Other: There is no periappendiceal region inflammatory change. No abscess or ascites is evident in the abdomen or pelvis. Musculoskeletal: No blastic or lytic bone lesions. No abdominal wall or intramuscular lesions are evident. IMPRESSION: 1. Fairly diffuse stool throughout much of the colon. No bowel wall thickening or bowel obstruction. Status post gastric bypass procedure without complicating features evident. 2.  Hepatic steatosis. Gallbladder absent. Borderline common hepatic and proximal common bile duct dilatation without mass or calculus evident by CT. 3. Leiomyoma within the mid uterus which causes mild impression upon the endometrium. Intrauterine device no longer present. 4. No renal or ureteral calculus. No hydronephrosis. Urinary bladder wall thickness normal. 5.  Small stable epicardial cyst on the right. Electronically Signed   By: Bretta BangWilliam  Woodruff III M.D.   On: 03/21/2020 16:18   DG UGI W SINGLE CM (SOL OR THIN BA)  Result Date: 03/30/2020 CLINICAL DATA:  Marginal ulcer. EXAM: UPPER GI SERIES WITH KUB TECHNIQUE: After obtaining a scout radiograph a routine upper GI series was  performed using thin and high density barium. FLUOROSCOPY TIME:  Fluoroscopy Time:  1 minutes, 30 seconds. Radiation Exposure Index (if provided by the fluoroscopic device): 80 mGy. Number of Acquired Spot Images: 8 COMPARISON:  CT abdomen/pelvis 03/27/2020. FINDINGS: A scout radiograph of the abdomen demonstrates a nonobstructive bowel gas pattern. Cholecystectomy clips within the right upper quadrant of the abdomen. A problem-oriented water-soluble contrast upper GI series was performed to assess for free perforation at site of a known marginal ulcer. Expected post Roux-en-Y anatomy. No intraperitoneal concha is demonstrated to free perforation. A known marginal ulcer with contained perforation was better delineated on the recent prior CT abdomen/pelvis of 03/27/2020. There is brisk passage of contrast from the distal esophagus into the gastric remnant and subsequently into the Roux limb. Moderate to severe intermittent esophageal dysmotility. IMPRESSION: Problem-oriented water-soluble contrast upper GI series performed to assess for free perforation at site of a known marginal ulcer. No intraperitoneal contrast is demonstrated to suggest free perforation. The known marginal ulcer with contained perforation was better delineated on  the recent prior CT abdomen/pelvis of 03/27/2020. Moderate severe intermittent esophageal dysmotility. Electronically Signed   By: Jackey Loge DO   On: 03/30/2020 14:15   US Abdomen Limited RUQ (LIVER/GB)  Result Date: 03/23/2020 CLINICAL DATA:  Hepatic cirrhosis. EXAM: ULTRASOUND ABDOMEN LIMITED RIGHT UPPER QUADRANT COMPARISON:  CT abdomen and pelvis March 21, 2020 FINDINGS: Gallbladder: Surgically absent Common bile duct: Diameter: 7 mm Liver: No focal lesion identified. Diffusely increased parenchymal echogenicity. Portal vein is patent on color Doppler imaging with normal direction of blood flow towards the liver. Other: None. IMPRESSION: 1. The echogenicity of the liver is increased. This is a nonspecific finding but is most commonly seen with fatty infiltration of the liver. There are no obvious focal liver lesions. 2. Prior cholecystectomy. Electronically Signed   By: Maudry Mayhew MD   On: 03/23/2020 12:31       ASSESSMENT & PLAN:  1. History of gastric bypass   2. Other iron deficiency anemia    Labs are reviewed and discussed with patient. Consistent with iron deficiency anemia. Etiology of iron deficiency likely due to malabsorption due to gastric bypass, GI bleeding from anastomosis ulcer, heavy menses.  Given her history of gastric bypass, she may not absorb oral iron supplementation well.  Discussed about option of IV Venofer treatments. Plan IV iron with Venofer 200mg  weekly x 4 doses. Allergy reactions/infusion reaction including anaphylactic reaction discussed with patient. Other side effects include but not limited to high blood pressure, skin rash, weight gain, leg swelling, etc. we also discussed about safety during first trimester is not well studied and may harm fetus.  Patient is sexually active and she uses condom as contraceptive measure.  She feels very low risk of being pregnant and does not feel checking urine pregnancy testing is necessary at this point.  She is  going to start her menses very soon.  Advised patient to update me if any failure with her current contraceptive measures, and urine pregnancy testing can be done prior to her IV iron treatments.. Patient voices understanding and willing to proceed.  History of gastric bypass, patient takes bariatric multivitamins.  Check folate and vitamin B12, vitamin D level at the next visit. Orders Placed This Encounter  Procedures  . Iron and TIBC    Standing Status:   Future    Standing Expiration Date:   04/08/2021  . Ferritin    Standing Status:   Future  Standing Expiration Date:   10/09/2020  . CBC with Differential/Platelet    Standing Status:   Future    Standing Expiration Date:   04/08/2021  . Folate    Standing Status:   Future    Standing Expiration Date:   04/08/2021  . Vitamin B12    Standing Status:   Future    Standing Expiration Date:   04/08/2021  . VITAMIN D 25 Hydroxy (Vit-D Deficiency, Fractures)    Standing Status:   Future    Standing Expiration Date:   04/08/2021    All questions were answered. The patient knows to call the clinic with any problems questions or concerns.  [facility]  Return of visit: 8 weeks Thank you for this kind referral and the opportunity to participate in the care of this patient. A copy of today's note is routed to referring provider   Rickard Patience, MD, PhD Hematology Oncology [facility] 04/08/2020

## 2020-04-08 NOTE — Progress Notes (Signed)
Patient here to establish care for anemia 

## 2020-04-09 ENCOUNTER — Encounter: Payer: Self-pay | Admitting: Oncology

## 2020-04-13 ENCOUNTER — Inpatient Hospital Stay: Payer: Self-pay

## 2020-04-13 VITALS — BP 108/69 | HR 73 | Temp 97.2°F | Resp 16

## 2020-04-13 DIAGNOSIS — D508 Other iron deficiency anemias: Secondary | ICD-10-CM

## 2020-04-13 MED ORDER — SODIUM CHLORIDE 0.9 % IV SOLN: 200.0000 mg | Freq: Once | INTRAVENOUS | Status: DC

## 2020-04-13 MED ORDER — IRON SUCROSE 20 MG/ML IV SOLN
200.0000 mg | Freq: Once | INTRAVENOUS | Status: AC
  Administered 2020-04-13: 200 mg via INTRAVENOUS
  Filled 2020-04-13: qty 10

## 2020-04-13 MED ORDER — SODIUM CHLORIDE 0.9 % IV SOLN
Freq: Once | INTRAVENOUS | Status: AC
  Filled 2020-04-13: qty 250

## 2020-04-14 ENCOUNTER — Encounter: Payer: Self-pay | Admitting: Gastroenterology

## 2020-04-14 ENCOUNTER — Ambulatory Visit: Payer: Self-pay | Admitting: Gastroenterology

## 2020-04-14 ENCOUNTER — Other Ambulatory Visit: Payer: Self-pay

## 2020-04-14 VITALS — BP 112/75 | HR 77 | Temp 97.6°F | Ht 62.0 in | Wt 178.6 lb

## 2020-04-14 DIAGNOSIS — K289 Gastrojejunal ulcer, unspecified as acute or chronic, without hemorrhage or perforation: Secondary | ICD-10-CM

## 2020-04-14 DIAGNOSIS — D509 Iron deficiency anemia, unspecified: Secondary | ICD-10-CM

## 2020-04-14 DIAGNOSIS — R748 Abnormal levels of other serum enzymes: Secondary | ICD-10-CM

## 2020-04-14 MED ORDER — PANTOPRAZOLE SODIUM 40 MG PO TBEC: 40.0000 mg | DELAYED_RELEASE_TABLET | Freq: Two times a day (BID) | ORAL | 0 refills | Status: AC

## 2020-04-14 MED ORDER — SUCRALFATE 1 GM/10ML PO SUSP: 1.0000 g | Freq: Three times a day (TID) | ORAL | 2 refills | Status: AC

## 2020-04-14 NOTE — Progress Notes (Unsigned)
Melanie Bouillon, MD [address]  [address]  [place], [place] [address]  Main: [phone]  Fax: [phone]   Primary Care Physician: [facility]   Chief Complaint  Patient presents with  . IDA    HPI: Melanie Olson is a 50 y.o. female recently admitted to the hospital with iron deficiency anemia and found to have anastomotic ulcer on 03/23/2020 upper endoscopy.  Patient has history of previous gastric bypass.  She was discharged from the hospital but returns 4 days later with worsening abdominal pain and was diagnosed with a contained perforation at the ulcer site that was managed conservatively by surgery.  Patient reports complete symptom resolution since his recent discharge.  No abdominal pain, nausea or vomiting, blood in stool.  Eating well.  Is taking PPI twice daily and sucralfate 4 times a day  Current Outpatient Medications  Medication Sig Dispense Refill  . acetaminophen (TYLENOL) 325 MG tablet Take 2 tablets (650 mg total) by mouth every 6 (six) hours as needed for mild pain (or Fever >/= 101).    Marland Kitchen acyclovir (ZOVIRAX) 200 MG capsule Take 2 capsules by mouth in the morning and at bedtime.    Marland Kitchen amitriptyline (ELAVIL) 25 MG tablet Take 25 mg by mouth at bedtime.    Marland Kitchen buPROPion (WELLBUTRIN XL) 150 MG 24 hr tablet Take 150 mg by mouth daily.    . cyclobenzaprine (FLEXERIL) 10 MG tablet Take 10 mg by mouth at bedtime as needed for muscle spasms.    . pantoprazole (PROTONIX) 40 MG tablet Take 1 tablet (40 mg total) by mouth 2 (two) times daily for 180 doses. 60 tablet 2  . sucralfate (CARAFATE) 1 GM/10ML suspension Take 10 mLs (1 g total) by mouth 4 (four) times daily -  with meals and at bedtime. 420 mL 1   No current facility-administered medications for this visit.    Allergies as of 04/14/2020 - Review Complete 04/14/2020  Allergen Reaction Noted  . Penicillins Rash 10/03/2014    ROS:  General: Negative for anorexia, weight  loss, fever, chills, fatigue, weakness. ENT: Negative for hoarseness, difficulty swallowing , nasal congestion. CV: Negative for chest pain, angina, palpitations, dyspnea on exertion, peripheral edema.  Respiratory: Negative for dyspnea at rest, dyspnea on exertion, cough, sputum, wheezing.  GI: See history of present illness. GU:  Negative for dysuria, hematuria, urinary incontinence, urinary frequency, nocturnal urination.  Endo: Negative for unusual weight change.    Physical Examination:   BP 112/75   Pulse 77   Temp 97.6 F (36.4 C) (Oral)   Ht 5\' 2"  (1.575 m)   Wt 178 lb 9.6 oz (81 kg)   BMI 32.67 kg/m   General: Well-nourished, well-developed in no acute distress.  Eyes: No icterus. Conjunctivae pink. Mouth: Oropharyngeal mucosa moist and pink , no lesions erythema or exudate. Neck: Supple, Trachea midline Abdomen: Bowel sounds are normal, nontender, nondistended, no hepatosplenomegaly or masses, no abdominal bruits or hernia , no rebound or guarding.   Extremities: No lower extremity edema. No clubbing or deformities. Neuro: Alert and oriented x 3.  Grossly intact. Skin: Warm and dry, no jaundice.   Psych: Alert and cooperative, normal mood and affect.   Labs: CMP     Component Value Date/Time   NA 139 04/01/2020 0408   NA 141 08/23/2012 0628   K 3.9 04/01/2020 0408   K 3.6 08/23/2012 0628   CL 102 04/01/2020 0408   CL 108 (H) 08/23/2012  0628   CO2 26 04/01/2020 0408   CO2 26 08/23/2012 0628   GLUCOSE 98 04/01/2020 0408   GLUCOSE 93 08/23/2012 0628   BUN <5 (L) 04/01/2020 0408   BUN 12 08/23/2012 0628   CREATININE 0.58 04/01/2020 0408   CREATININE 0.77 08/23/2012 0628   CALCIUM 8.7 (L) 04/01/2020 0408   CALCIUM 8.6 08/23/2012 0628   PROT 5.3 (L) 03/30/2020 0401   PROT 7.2 08/23/2012 0628   ALBUMIN 2.5 (L) 03/30/2020 0401   ALBUMIN 3.5 08/23/2012 0628   AST 55 (H) 03/30/2020 0401   AST 17 08/23/2012 0628   ALT 71 (H) 03/30/2020 0401   ALT 20 08/23/2012  0628   ALKPHOS 161 (H) 03/30/2020 0401   ALKPHOS 105 08/23/2012 0628   BILITOT 0.6 03/30/2020 0401   BILITOT 0.5 08/23/2012 0628   GFRNONAA >60 04/01/2020 0408   GFRNONAA >60 08/23/2012 0628   GFRAA >60 06/01/2019 1448   GFRAA >60 08/23/2012 0628   Lab Results  Component Value Date   WBC 3.8 (L) 04/01/2020   HGB 10.9 (L) 04/01/2020   HCT 37.7 04/01/2020   MCV 75.1 (L) 04/01/2020   PLT 488 (H) 04/01/2020    Imaging Studies: CT Abdomen Pelvis W Contrast  Result Date: 03/27/2020 CLINICAL DATA:  Acute, nonlocalized abdominal pain, recent discharge on Tuesday, ulcer at the anastomosis from gastric bypass, anemia requiring transfusion EXAM: CT ABDOMEN AND PELVIS WITH CONTRAST TECHNIQUE: Multidetector CT imaging of the abdomen and pelvis was performed using the standard protocol following bolus administration of intravenous contrast. CONTRAST:  100mL OMNIPAQUE IOHEXOL 300 MG/ML  SOLN COMPARISON:  CT 03/21/2020 FINDINGS: Lower chest: Lung bases are clear. Normal heart size. No pericardial effusion. Hepatobiliary: Diffuse hepatic hypoattenuation compatible with hepatic steatosis. Some more focal infiltration also noted near the falciform ligament. Smooth liver surface contour. No concerning focal liver lesion. Prior cholecystectomy. Slight prominence of the biliary tree is similar to prior. No visible calcified gallstone. Pancreas: No pancreatic ductal dilatation or surrounding inflammatory changes. Spleen: Normal in size. No concerning splenic lesions. Adrenals/Urinary Tract: Normal adrenal glands. Kidneys are normally located with symmetric enhancementand excretion with bilateral extrarenal pelves. Small subcentimeter hypoattenuating focus in the right kidney (7/10) too small to fully characterize on CT imaging but statistically likely benign. Bilateral parapelvic cysts are unchanged prior. No suspicious renal lesion, urolithiasis or hydronephrosis. Urinary bladder is largely decompressed at the time  of exam and therefore poorly evaluated by CT imaging. 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 There is some increasing stranding noted adjacent the anastomotic line with what appears to be a contained, perforated ulcer (2/22, 5/35). No frank spillage succus nor free air with peritoneum. Distal anastomosis appears patent. No obstructive changes are evident. No other small bowel thickening or dilatation. No colonic dilatation or wall thickening. A normal appendix is visualized. Vascular/Lymphatic: No significant vascular findings are present. No enlarged abdominal or pelvic lymph nodes. Reproductive: Anterior left fundal fibroid, similar to prior with slight distortion of the fundal endometrium. Simple appearing fluid attenuation cysts in the right adnexa. No concerning adnexal lesions. Other: Stranding and inflammation centered upon the contained perforation of a marginal ulcer near the gastrojejunal anastomosis in the upper abdomen. No frank spillage of succus or free air. Musculoskeletal: Multilevel degenerative changes are present in the imaged portions of the spine. No acute osseous abnormality or suspicious osseous lesion. IMPRESSION: 1. 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 Increasing conspicuity from the recent comparison. No frank spillage of succus or free air with peritoneum. 2. Hepatic steatosis. 3. Fibroid uterus. These results were called by telephone at the time of interpretation on 03/27/2020 at 8:41 pm to provider [facility] , who verbally acknowledged these results. Electronically Signed   By: Kreg Shropshire M.D.   On: 03/27/2020 20:42   CT Abdomen Pelvis W Contrast  Result Date: 03/21/2020 CLINICAL DATA:   Abdominal pain EXAM: CT ABDOMEN AND PELVIS WITH CONTRAST TECHNIQUE: Multidetector CT imaging of the abdomen and pelvis was performed using the standard protocol following bolus administration of intravenous contrast. Oral contrast was also administered. CONTRAST:  OMNIPAQUE IOHEXOL 300 MG/ML  SOLN COMPARISON:  June 01, 2019 FINDINGS: Lower chest: Lung bases are clear. There is a stable right epicardial cyst measuring 2.6 x 1.3 cm. Hepatobiliary: There is hepatic steatosis. Beyond hepatic steatosis, no focal liver lesions are appreciable. Gallbladder is absent. The common hepatic duct remains prominent at 10 mm, stable. No biliary duct mass or calculus evident by CT. Pancreas: No pancreatic mass or inflammatory focus evident. Spleen: No splenic lesions are evident. Adrenals/Urinary Tract: Adrenals bilaterally appear normal. There is a cyst measuring 8 mm in the lateral mid right kidney. There are parapelvic cysts on the right, largest measuring 2.5 x 1.5 cm. There is a 1 x 1 cm parapelvic cyst on the left. There is a cyst medial to the renal pelvis in the mid left kidney measuring 7 x 7 mm. There is an extrarenal pelvis on each side, an anatomic variant. There is no hydronephrosis on either side. There is no evident renal or ureteral calculus on either side. Urinary bladder is midline with wall thickness within normal limits. Stomach/Bowel: Patient is status post gastric bypass procedure. No wall thickening or fluid in perioperative regions noted. There is diffuse stool throughout much of the colon. There is no appreciable diverticular disease. No appreciable bowel wall or mesenteric thickening evident. There is no appreciable bowel obstruction. The terminal ileum appears normal. There is no appreciable free air or portal venous air. Vascular/Lymphatic: There is no abdominal aortic aneurysm. No arterial vascular lesions are evident. Major venous structures appear patent. There is no evident adenopathy in the  abdomen or pelvis. Scattered subcentimeter mesenteric lymph nodes are considered nonspecific. Reproductive: Uterus is anteverted. There is a decreased attenuation mass in the volar aspect of the uterus measuring 3.6 x 3.6 cm, an apparent leiomyoma. This apparent leiomyoma displaces the endometrium slightly dorsally. No adnexal masses identified beyond physiologic follicles. Intrauterine device no longer present. Other: There is no periappendiceal region inflammatory change. No abscess or ascites is evident in the abdomen or pelvis. Musculoskeletal: No blastic or lytic bone lesions. No abdominal wall or intramuscular lesions are evident. IMPRESSION: 1. Fairly diffuse stool throughout much of the colon. No bowel wall thickening or bowel obstruction. Status post gastric bypass procedure without complicating features evident. 2. Hepatic steatosis. Gallbladder absent. Borderline common hepatic and proximal common bile duct dilatation without mass or calculus evident by CT. 3. Leiomyoma within the mid uterus which causes mild impression upon the endometrium. Intrauterine device no longer present. 4. No renal or ureteral calculus. No hydronephrosis. Urinary bladder wall thickness normal. 5.  Small stable epicardial cyst on the right. Electronically Signed   By: Bretta Bang III M.D.   On: 03/21/2020 16:18  DG UGI W SINGLE CM (SOL OR THIN BA)  Result Date: 03/30/2020 CLINICAL DATA:  Marginal ulcer. EXAM: UPPER GI SERIES WITH KUB TECHNIQUE: After obtaining a scout radiograph a routine upper GI series was performed using thin and high density barium. FLUOROSCOPY TIME:  Fluoroscopy Time:  1 minutes, 30 seconds. Radiation Exposure Index (if provided by the fluoroscopic device): 80 mGy. Number of Acquired Spot Images: 8 COMPARISON:  CT abdomen/pelvis 03/27/2020. FINDINGS: A scout radiograph of the abdomen demonstrates a nonobstructive bowel gas pattern. Cholecystectomy clips within the right upper quadrant of the  abdomen. A problem-oriented water-soluble contrast upper GI series was performed to assess for free perforation at site of a known marginal ulcer. Expected post Roux-en-Y anatomy. No intraperitoneal concha is demonstrated to free perforation. A known marginal ulcer with contained perforation was better delineated on the recent prior CT abdomen/pelvis of 03/27/2020. There is brisk passage of contrast from the distal esophagus into the gastric remnant and subsequently into the Roux limb. Moderate to severe intermittent esophageal dysmotility. IMPRESSION: Problem-oriented water-soluble contrast upper GI series performed to assess for free perforation at site of a known marginal ulcer. No intraperitoneal contrast is demonstrated to suggest free perforation. The known marginal ulcer with contained perforation was better delineated on the recent prior CT abdomen/pelvis of 03/27/2020. Moderate severe intermittent esophageal dysmotility. Electronically Signed   By: Jackey Loge DO   On: 03/30/2020 14:15   US Abdomen Limited RUQ (LIVER/GB)  Result Date: 03/23/2020 CLINICAL DATA:  Hepatic cirrhosis. EXAM: ULTRASOUND ABDOMEN LIMITED RIGHT UPPER QUADRANT COMPARISON:  CT abdomen and pelvis March 21, 2020 FINDINGS: Gallbladder: Surgically absent Common bile duct: Diameter: 7 mm Liver: No focal lesion identified. Diffusely increased parenchymal echogenicity. Portal vein is patent on color Doppler imaging with normal direction of blood flow towards the liver. Other: None. IMPRESSION: 1. The echogenicity of the liver is increased. This is a nonspecific finding but is most commonly seen with fatty infiltration of the liver. There are no obvious focal liver lesions. 2. Prior cholecystectomy. Electronically Signed   By: Maudry Mayhew MD   On: 03/23/2020 12:31    Assessment and Plan:   MARLAYA TURCK is a 50 y.o. y/o female with history of gastric bypass, iron deficiency anemia, anastomotic ulcer recent hospitalization for  anemia from anastomotic ulcer, and subsequent contained perforation and ulcer site here for follow-up  Patient clinically doing well at this time Importance of medication compliance encouraged and patient has been taking her medications properly including Protonix and sucralfate  I will plan on repeating CT scan in 3 months to ensure resolution of previously seen abnormalities on CT scan and if normal, repeat upper endoscopy to ensure complete resolution of anastomotic ulcer  Avoid NSAID use such as Ibuprofen, Aleeve, advil, motrin, BC and Goodie powder, Naproxen, Meloxicam and others.   Patient agreeable to follow-up in clinic in 3 months and will schedule above tests at that time  (Risks of PPI use were discussed with patient including bone loss, C. Diff diarrhea, pneumonia, infections, CKD, electrolyte abnormalities.  Pt. Verbalizes understanding and chooses to continue the medication.)  Patient advised to call us if she is unable to get her refill from the pharmacy, or if she runs out of the medication before her next visit and she verbalized understanding  Liver enzymes recently elevated, likely due to acute illness, but she has hepatic steatosis suggested on imaging.  Will obtain further work-up for fatty liver  Finding of fatty liver on imaging discussed with  patient Diet, weight loss, and exercise encouraged along with avoiding hepatotoxic drugs including alcohol Risk of progression to cirrhosis if above measures are not instituted were discussed as well, and patient verbalized understanding     Dr Melanie Olson

## 2020-04-20 ENCOUNTER — Inpatient Hospital Stay: Payer: Self-pay

## 2020-04-20 VITALS — BP 109/76 | HR 69 | Temp 99.1°F | Resp 17

## 2020-04-20 DIAGNOSIS — D508 Other iron deficiency anemias: Secondary | ICD-10-CM

## 2020-04-20 MED ORDER — SODIUM CHLORIDE 0.9 % IV SOLN: 200.0000 mg | Freq: Once | INTRAVENOUS | Status: DC

## 2020-04-20 MED ORDER — SODIUM CHLORIDE 0.9 % IV SOLN
Freq: Once | INTRAVENOUS | Status: AC
  Filled 2020-04-20: qty 250

## 2020-04-20 MED ORDER — IRON SUCROSE 20 MG/ML IV SOLN
200.0000 mg | Freq: Once | INTRAVENOUS | Status: AC
  Administered 2020-04-20: 200 mg via INTRAVENOUS
  Filled 2020-04-20: qty 10

## 2020-04-22 NOTE — Progress Notes (Signed)
The following Medication: venofer is approved for drug replacement program by [employer]. The enrollment period is from 04/22/2020 to 04/22/2021.  Reason for Assistance: Self. ID: [id number] First DOS:04/13/2020 & 04/20/2020.  Virgina Jock, CPhT IV Drug Replacement Specialist  [facility] Phone: [phone]

## 2020-04-27 ENCOUNTER — Inpatient Hospital Stay: Payer: Self-pay

## 2020-04-27 ENCOUNTER — Other Ambulatory Visit: Payer: Self-pay

## 2020-04-27 ENCOUNTER — Ambulatory Visit: Payer: Self-pay | Admitting: Gastroenterology

## 2020-04-27 VITALS — BP 114/72 | HR 88 | Temp 98.2°F

## 2020-04-27 DIAGNOSIS — D508 Other iron deficiency anemias: Secondary | ICD-10-CM

## 2020-04-27 MED ORDER — IRON SUCROSE 20 MG/ML IV SOLN
200.0000 mg | Freq: Once | INTRAVENOUS | Status: AC
  Administered 2020-04-27: 200 mg via INTRAVENOUS
  Filled 2020-04-27: qty 10

## 2020-04-27 MED ORDER — SODIUM CHLORIDE 0.9 % IV SOLN
Freq: Once | INTRAVENOUS | Status: AC
  Filled 2020-04-27: qty 250

## 2020-04-27 MED ORDER — SODIUM CHLORIDE 0.9 % IV SOLN: 200.0000 mg | Freq: Once | INTRAVENOUS | Status: DC

## 2020-05-04 ENCOUNTER — Inpatient Hospital Stay: Payer: Self-pay

## 2020-05-04 ENCOUNTER — Other Ambulatory Visit: Payer: Self-pay

## 2020-05-04 VITALS — BP 114/76 | HR 76 | Temp 98.5°F | Resp 16

## 2020-05-04 DIAGNOSIS — D508 Other iron deficiency anemias: Secondary | ICD-10-CM

## 2020-05-04 MED ORDER — SODIUM CHLORIDE 0.9 % IV SOLN: 200.0000 mg | Freq: Once | INTRAVENOUS | Status: DC

## 2020-05-04 MED ORDER — SODIUM CHLORIDE 0.9 % IV SOLN
Freq: Once | INTRAVENOUS | Status: AC
  Filled 2020-05-04: qty 250

## 2020-05-04 MED ORDER — IRON SUCROSE 20 MG/ML IV SOLN
200.0000 mg | Freq: Once | INTRAVENOUS | Status: AC
  Administered 2020-05-04: 200 mg via INTRAVENOUS
  Filled 2020-05-04: qty 10

## 2020-05-04 NOTE — Progress Notes (Signed)
Patient reports "severe constipation". States her last BM was 05/01/20 despite taking OTC stool softeners and Miralax. Patient denies any pain this time. Dr. Cathie Hoops made aware and Per Dr. Cathie Hoops patient is to take:    Patient informed of information above and advised to reach out to PCP should it continue. Patient verbalizes understanding and denies any further questions or concerns.

## 2020-06-03 ENCOUNTER — Inpatient Hospital Stay: Payer: Self-pay | Attending: Oncology

## 2020-06-03 DIAGNOSIS — Z9884 Bariatric surgery status: Secondary | ICD-10-CM | POA: Insufficient documentation

## 2020-06-03 DIAGNOSIS — Z79899 Other long term (current) drug therapy: Secondary | ICD-10-CM | POA: Insufficient documentation

## 2020-06-03 DIAGNOSIS — D508 Other iron deficiency anemias: Secondary | ICD-10-CM | POA: Insufficient documentation

## 2020-06-03 LAB — CBC WITH DIFFERENTIAL/PLATELET
Abs Immature Granulocytes: 0.01 10*3/uL (ref 0.00–0.07)
Basophils Absolute: 0 10*3/uL (ref 0.0–0.1)
Basophils Relative: 0 %
Eosinophils Absolute: 0.1 10*3/uL (ref 0.0–0.5)
Eosinophils Relative: 2 %
HCT: 43.9 % (ref 36.0–46.0)
Hemoglobin: 14.2 g/dL (ref 12.0–15.0)
Immature Granulocytes: 0 %
Lymphocytes Relative: 29 %
Lymphs Abs: 1.3 10*3/uL (ref 0.7–4.0)
MCH: 28.3 pg (ref 26.0–34.0)
MCHC: 32.3 g/dL (ref 30.0–36.0)
MCV: 87.5 fL (ref 80.0–100.0)
Monocytes Absolute: 0.3 10*3/uL (ref 0.1–1.0)
Monocytes Relative: 5 %
Neutro Abs: 2.9 10*3/uL (ref 1.7–7.7)
Neutrophils Relative %: 64 %
Platelets: 256 10*3/uL (ref 150–400)
RBC: 5.02 MIL/uL (ref 3.87–5.11)
RDW: 19.6 % — ABNORMAL HIGH (ref 11.5–15.5)
WBC: 4.6 10*3/uL (ref 4.0–10.5)
nRBC: 0 % (ref 0.0–0.2)

## 2020-06-03 LAB — IRON AND TIBC
Iron: 61 ug/dL (ref 28–170)
Saturation Ratios: 17 % (ref 10.4–31.8)
TIBC: 360 ug/dL (ref 250–450)
UIBC: 299 ug/dL

## 2020-06-03 LAB — VITAMIN B12: Vitamin B-12: 779 pg/mL (ref 180–914)

## 2020-06-03 LAB — FOLATE: Folate: 30 ng/mL (ref >5.9)

## 2020-06-03 LAB — FERRITIN: Ferritin: 55 ng/mL (ref 11–307)

## 2020-06-03 LAB — VITAMIN D 25 HYDROXY (VIT D DEFICIENCY, FRACTURES): Vit D, 25-Hydroxy: 57.41 ng/mL (ref 30–100)

## 2020-06-05 ENCOUNTER — Inpatient Hospital Stay: Payer: Self-pay

## 2020-06-05 ENCOUNTER — Inpatient Hospital Stay (HOSPITAL_BASED_OUTPATIENT_CLINIC_OR_DEPARTMENT_OTHER): Payer: Self-pay | Admitting: Oncology

## 2020-06-05 ENCOUNTER — Encounter: Payer: Self-pay | Admitting: Oncology

## 2020-06-05 VITALS — BP 120/81 | HR 85 | Temp 97.8°F | Resp 20 | Wt 177.0 lb

## 2020-06-05 DIAGNOSIS — Z9884 Bariatric surgery status: Secondary | ICD-10-CM

## 2020-06-05 DIAGNOSIS — D508 Other iron deficiency anemias: Secondary | ICD-10-CM

## 2020-06-05 NOTE — Progress Notes (Signed)
Hematology/Oncology Consult note [facility] [phone] [phone]   Patient Care Team: [facility]CP - General (General Practice)  REFERRING PROVIDER: [facility] CHIEF COMPLAINTS/REASON FOR VISIT:  Evaluation of iron deficiency anemia  HISTORY OF PRESENTING ILLNESS:  Melanie Olson is a  50 y.o.  female with PMH listed below was seen in consultation at the request of [facility]   for evaluation of iron deficiency anemia.   03/21/2020-03/24/2020 patient was recently admitted due to abdominal pain.   Patient has been seen by gastroenterology and EGD showed 8 mm ulcer at the gastrojejunal anastomosis.  Patient was recommended to continue Protonix twice daily as well as Carafate. Patient was discharged and she returned to emergency room on 03/27/2020 due to worsening of abdominal pain. She underwent a repeat CT scan which revealed some increasing stranding and inflammation near the GJ anastomotic line with what appeared to be a possible contained perforation of the ulcer noted on her prior upper endoscopy. No free fluid or free air was definitively noted.  Patient was treated with empiric IV antibiotics and antifungals.  Underwent upper GI barium swallow which did not show any perforation. Patient was also found to have anemia with iron deficiency. 03/21/2020, hemoglobin 6.7.  Status post PRBC transfusions in the hospital.  Patient was referred to establish care with hematology for treatment of iron deficiency anemia. Patient denies any abdominal pain today.  Since discharge, she has felt improvement of her fatigue.  Denies any black or bloody bowel movement.  Reports that menses is heavy sometimes.  INTERVAL HISTORY Melanie Olson is a 50 y.o. female who has above history reviewed by me today presents for follow up visit for management of Iron deficiency anemia Problems and complaints are  listed below: Patient has received IV Venofer treatments.  She reports fatigue has significantly improved   Review of Systems  Constitutional: Negative for appetite change, chills, fatigue and fever.  HENT:   Negative for hearing loss and voice change.   Eyes: Negative for eye problems.  Respiratory: Negative for chest tightness and cough.   Cardiovascular: Negative for chest pain.  Gastrointestinal: Negative for abdominal distention, abdominal pain and blood in stool.  Endocrine: Negative for hot flashes.  Genitourinary: Negative for difficulty urinating and frequency.   Musculoskeletal: Negative for arthralgias.  Skin: Negative for itching and rash.  Neurological: Negative for extremity weakness.  Hematological: Negative for adenopathy.  Psychiatric/Behavioral: Negative for confusion.    MEDICAL HISTORY:  Past Medical History:  Diagnosis Date  . Abdominal wall ulcer (HCC)   . Depression   . GERD (gastroesophageal reflux disease)   . HSV (herpes simplex virus) infection   . IDA (iron deficiency anemia) 04/08/2020  . Migraine     SURGICAL HISTORY: Past Surgical History:  Procedure Laterality Date  . CHOLECYSTECTOMY    . COLONOSCOPY WITH PROPOFOL N/A 07/11/2019   Procedure: COLONOSCOPY WITH PROPOFOL;  Surgeon: Pasty Spillersahiliani, Varnita B, MD;  Location: [facility] ENDOSCOPY;  Service: Endoscopy;  Laterality: N/A;  . ESOPHAGOGASTRODUODENOSCOPY (EGD) WITH PROPOFOL N/A 07/11/2019   Procedure: ESOPHAGOGASTRODUODENOSCOPY (EGD) WITH PROPOFOL;  Surgeon: Pasty Spillersahiliani, Varnita B, MD;  Location: [facility] ENDOSCOPY;  Service: Endoscopy;  Laterality: N/A;  . ESOPHAGOGASTRODUODENOSCOPY (EGD) WITH PROPOFOL N/A 03/23/2020   Procedure: ESOPHAGOGASTRODUODENOSCOPY (EGD) WITH PROPOFOL;  Surgeon: Pasty Spillersahiliani, Varnita B, MD;  Location: [facility] ENDOSCOPY;  Service: Endoscopy;  Laterality: N/A;  . GASTRIC BYPASS  2006  . tummy tuck  SOCIAL HISTORY: Social History   Socioeconomic History  . Marital status: Significant  Other    Spouse name: Not on file  . Number of children: 2  . Years of education: Not on file  . Highest education level: Not on file  Occupational History  . Not on file  Tobacco Use  . Smoking status: Former Smoker    Years: 5.00    Types: Cigarettes    Quit date: 04/08/2008    Years since quitting: 12.1  . Smokeless tobacco: Never Used  . Tobacco comment: social smoking on and off for 5 years   Vaping Use  . Vaping Use: Never used  Substance and Sexual Activity  . Alcohol use: No  . Drug use: No  . Sexual activity: Not on file  Other Topics Concern  . Not on file  Social History Narrative  . Not on file   Social Determinants of Health   Financial Resource Strain: Not on file  Food Insecurity: Not on file  Transportation Needs: Not on file  Physical Activity: Not on file  Stress: Not on file  Social Connections: Not on file  Intimate Partner Violence: Not on file    FAMILY HISTORY: Family History  Problem Relation Age of Onset  . High Cholesterol Mother   . Stroke Father   . Aneurysm Father   . Diabetes Maternal Grandmother   . Heart disease Paternal Grandmother     ALLERGIES:  is allergic to penicillins.  MEDICATIONS:  Current Outpatient Medications  Medication Sig Dispense Refill  . acetaminophen (TYLENOL) 325 MG tablet Take 2 tablets (650 mg total) by mouth every 6 (six) hours as needed for mild pain (or Fever >/= 101).    Marland Kitchen acyclovir (ZOVIRAX) 200 MG capsule Take 2 capsules by mouth in the morning and at bedtime.    Marland Kitchen amitriptyline (ELAVIL) 25 MG tablet Take 25 mg by mouth at bedtime.    . cyclobenzaprine (FLEXERIL) 10 MG tablet Take 10 mg by mouth at bedtime as needed for muscle spasms.    . pantoprazole (PROTONIX) 40 MG tablet Take 1 tablet (40 mg total) by mouth 2 (two) times daily for 180 doses. 90 tablet 0  . buPROPion (WELLBUTRIN XL) 150 MG 24 hr tablet Take 150 mg by mouth daily. (Patient not taking: Reported on 06/05/2020)    . sucralfate  (CARAFATE) 1 GM/10ML suspension Take 10 mLs (1 g total) by mouth 4 (four) times daily -  with meals and at bedtime. (Patient not taking: Reported on 06/05/2020) 420 mL 2   No current facility-administered medications for this visit.     PHYSICAL EXAMINATION: ECOG PERFORMANCE STATUS: 1 - Symptomatic but completely ambulatory Vitals:   06/05/20 1404  BP: 120/81  Pulse: 85  Resp: 20  Temp: 97.8 F (36.6 C)  SpO2: 100%   Filed Weights   06/05/20 1404  Weight: 177 lb (80.3 kg)    Physical Exam Constitutional:      General: She is not in acute distress. HENT:     Head: Normocephalic and atraumatic.  Eyes:     General: No scleral icterus. Cardiovascular:     Rate and Rhythm: Normal rate and regular rhythm.     Heart sounds: Normal heart sounds.  Pulmonary:     Effort: Pulmonary effort is normal. No respiratory distress.     Breath sounds: No wheezing.  Abdominal:     General: Bowel sounds are normal. There is no distension.     Palpations: Abdomen  is soft.  Musculoskeletal:        General: No deformity. Normal range of motion.     Cervical back: Normal range of motion and neck supple.  Skin:    General: Skin is warm and dry.     Findings: No erythema or rash.  Neurological:     Mental Status: She is alert and oriented to person, place, and time. Mental status is at baseline.     Cranial Nerves: No cranial nerve deficit.     Coordination: Coordination normal.  Psychiatric:        Mood and Affect: Mood normal.       CMP Latest Ref Rng & Units 04/01/2020  Glucose 70 - 99 mg/dL 98  BUN 6 - 20 mg/dL <5(A)  Creatinine 2.13 - 1.00 mg/dL 0.86  Sodium 578 - 469 mmol/L 139  Potassium 3.5 - 5.1 mmol/L 3.9  Chloride 98 - 111 mmol/L 102  CO2 22 - 32 mmol/L 26  Calcium 8.9 - 10.3 mg/dL 6.2(X)  Total Protein 6.5 - 8.1 g/dL -  Total Bilirubin 0.3 - 1.2 mg/dL -  Alkaline Phos 38 - 528 U/L -  AST 15 - 41 U/L -  ALT 0 - 44 U/L -   CBC Latest Ref Rng & Units 06/03/2020  WBC  4.0 - 10.5 K/uL 4.6  Hemoglobin 12.0 - 15.0 g/dL 41.3  Hematocrit 24.4 - 46.0 % 43.9  Platelets 150 - 400 K/uL 256     LABORATORY DATA:  I have reviewed the data as listed Lab Results  Component Value Date   WBC 4.6 06/03/2020   HGB 14.2 06/03/2020   HCT 43.9 06/03/2020   MCV 87.5 06/03/2020   PLT 256 06/03/2020   Recent Labs    03/23/20 0434 03/24/20 0525 03/27/20 1728 03/28/20 2311 03/29/20 0432 03/30/20 0401 03/31/20 0456 04/01/20 0408  NA 140   < > 137 137   < > 137 138 139  K 3.4*   < > 3.5 3.7   < > 3.5 3.3* 3.9  CL 106   < > 102 103   < > 104 102 102  CO2 25   < > 25 25   < > 23 26 26   GLUCOSE 90   < > 101* 102*   < > 58* 74 98  BUN 6   < > 9 7   < > 6 <5* <5*  CREATININE 0.54   < > 0.57 0.59   < > 0.73 0.64 0.58  CALCIUM 8.1*   < > 9.0 8.7*   < > 7.9* 8.4* 8.7*  GFRNONAA >60   < > >60 >60   < > >60 >60 >60  PROT 5.8*  --  7.8 6.3*  --  5.3*  --   --   ALBUMIN 2.9*  --  3.9 3.1*  --  2.5*  --   --   AST 40  --  19 250*  --  55*  --   --   ALT 58*  --  30 124*  --  71*  --   --   ALKPHOS 155*  --  139* 228*  --  161*  --   --   BILITOT 0.8  --  0.9 1.1  --  0.6  --   --   BILIDIR 0.3*  --   --   --   --   --   --   --   IBILI 0.5  --   --   --   --   --   --   --    < > =  values in this interval not displayed.   Iron/TIBC/Ferritin/ %Sat    Component Value Date/Time   IRON 61 06/03/2020 1526   TIBC 360 06/03/2020 1526   FERRITIN 55 06/03/2020 1526   IRONPCTSAT 17 06/03/2020 1526     RADIOGRAPHIC STUDIES: I have personally reviewed the radiological images as listed and agreed with the findings in the report. No results found.     ASSESSMENT & PLAN:  1. History of gastric bypass   2. Other iron deficiency anemia    #Iron deficiency anemia in the context of gastric bypass Labs are reviewed and discussed with patient Both hemoglobin and iron panel have improved. I will hold off additional IV Venofer at this point. Discussed with patient that due  to the gastric bypass, she most likely will need maintenance Venofer treatment periodically.  I suggest to monitor counts every 6 months patient agrees.  History of gastric bypass, patient takes bariatric multivitamins.  Normal folate and vitamin B12, vitamin D level  Orders Placed This Encounter  Procedures  . CBC with Differential/Platelet    Standing Status:   Future    Standing Expiration Date:   06/05/2021  . Comprehensive metabolic panel    Standing Status:   Future    Standing Expiration Date:   06/05/2021  . Ferritin    Standing Status:   Future    Standing Expiration Date:   06/05/2021  . Folate    Standing Status:   Future    Standing Expiration Date:   06/05/2021  . Iron and TIBC    Standing Status:   Future    Standing Expiration Date:   06/05/2021  . Vitamin B12    Standing Status:   Future    Standing Expiration Date:   06/05/2021    All questions were answered. The patient knows to call the clinic with any problems questions or concerns.  [facility]  Return of visit: 6 months   Rickard Patience, MD, PhD Hematology Oncology [facility] 06/05/2020

## 2020-06-05 NOTE — Progress Notes (Signed)
Patient states that her toes are starting to cross over and she not able to uncross them with her toes she has to use her hands to uncross her toes. Patient states this is also causing her pain.

## 2020-07-01 ENCOUNTER — Encounter: Payer: Self-pay | Admitting: Skilled Nursing Facility1

## 2020-07-21 ENCOUNTER — Encounter: Payer: Self-pay | Admitting: Gastroenterology

## 2020-07-21 ENCOUNTER — Ambulatory Visit: Payer: Self-pay | Admitting: Gastroenterology

## 2020-08-13 ENCOUNTER — Ambulatory Visit: Payer: Self-pay | Admitting: Skilled Nursing Facility1

## 2020-12-01 ENCOUNTER — Inpatient Hospital Stay: Payer: Self-pay | Attending: Oncology

## 2020-12-01 ENCOUNTER — Other Ambulatory Visit: Payer: Self-pay

## 2020-12-01 DIAGNOSIS — Z87891 Personal history of nicotine dependence: Secondary | ICD-10-CM | POA: Insufficient documentation

## 2020-12-01 DIAGNOSIS — D508 Other iron deficiency anemias: Secondary | ICD-10-CM | POA: Insufficient documentation

## 2020-12-01 DIAGNOSIS — Z9884 Bariatric surgery status: Secondary | ICD-10-CM | POA: Insufficient documentation

## 2020-12-01 LAB — COMPREHENSIVE METABOLIC PANEL
ALT: 18 U/L (ref 0–44)
AST: 16 U/L (ref 15–41)
Albumin: 3.9 g/dL (ref 3.5–5.0)
Alkaline Phosphatase: 67 U/L (ref 38–126)
Anion gap: 6 (ref 5–15)
BUN: 20 mg/dL (ref 6–20)
CO2: 27 mmol/L (ref 22–32)
Calcium: 8.7 mg/dL — ABNORMAL LOW (ref 8.9–10.3)
Chloride: 103 mmol/L (ref 98–111)
Creatinine, Ser: 0.53 mg/dL (ref 0.44–1.00)
GFR, Estimated: >60 mL/min (ref >60)
Glucose, Bld: 95 mg/dL (ref 70–99)
Potassium: 3.9 mmol/L (ref 3.5–5.1)
Sodium: 136 mmol/L (ref 135–145)
Total Bilirubin: 0.8 mg/dL (ref 0.3–1.2)
Total Protein: 6.9 g/dL (ref 6.5–8.1)

## 2020-12-01 LAB — CBC WITH DIFFERENTIAL/PLATELET
Abs Immature Granulocytes: 0.01 10*3/uL (ref 0.00–0.07)
Basophils Absolute: 0 10*3/uL (ref 0.0–0.1)
Basophils Relative: 0 %
Eosinophils Absolute: 0.2 10*3/uL (ref 0.0–0.5)
Eosinophils Relative: 4 %
HCT: 41 % (ref 36.0–46.0)
Hemoglobin: 13.6 g/dL (ref 12.0–15.0)
Immature Granulocytes: 0 %
Lymphocytes Relative: 27 %
Lymphs Abs: 1.2 10*3/uL (ref 0.7–4.0)
MCH: 30.8 pg (ref 26.0–34.0)
MCHC: 33.2 g/dL (ref 30.0–36.0)
MCV: 92.8 fL (ref 80.0–100.0)
Monocytes Absolute: 0.4 10*3/uL (ref 0.1–1.0)
Monocytes Relative: 8 %
Neutro Abs: 2.9 10*3/uL (ref 1.7–7.7)
Neutrophils Relative %: 61 %
Platelets: 264 10*3/uL (ref 150–400)
RBC: 4.42 MIL/uL (ref 3.87–5.11)
RDW: 11.6 % (ref 11.5–15.5)
WBC: 4.7 10*3/uL (ref 4.0–10.5)
nRBC: 0 % (ref 0.0–0.2)

## 2020-12-01 LAB — IRON AND TIBC
Iron: 60 ug/dL (ref 28–170)
Saturation Ratios: 15 % (ref 10.4–31.8)
TIBC: 412 ug/dL (ref 250–450)
UIBC: 352 ug/dL

## 2020-12-01 LAB — FERRITIN: Ferritin: 6 ng/mL — ABNORMAL LOW (ref 11–307)

## 2020-12-04 ENCOUNTER — Other Ambulatory Visit: Payer: Self-pay

## 2020-12-04 ENCOUNTER — Inpatient Hospital Stay (HOSPITAL_BASED_OUTPATIENT_CLINIC_OR_DEPARTMENT_OTHER): Payer: Self-pay | Admitting: Nurse Practitioner

## 2020-12-04 ENCOUNTER — Encounter: Payer: Self-pay | Admitting: Nurse Practitioner

## 2020-12-04 ENCOUNTER — Ambulatory Visit: Payer: Self-pay | Admitting: Oncology

## 2020-12-04 ENCOUNTER — Inpatient Hospital Stay: Payer: Self-pay

## 2020-12-04 VITALS — BP 135/83 | HR 77 | Temp 99.1°F | Resp 17 | Wt 173.0 lb

## 2020-12-04 DIAGNOSIS — D508 Other iron deficiency anemias: Secondary | ICD-10-CM

## 2020-12-04 MED ORDER — IRON SUCROSE 20 MG/ML IV SOLN
200.0000 mg | Freq: Once | INTRAVENOUS | Status: AC
  Administered 2020-12-04: 200 mg via INTRAVENOUS
  Filled 2020-12-04: qty 10

## 2020-12-04 MED ORDER — SODIUM CHLORIDE 0.9 % IV SOLN
Freq: Once | INTRAVENOUS | Status: AC
  Filled 2020-12-04: qty 250

## 2020-12-04 MED ORDER — SODIUM CHLORIDE 0.9 % IV SOLN: 200.0000 mg | Freq: Once | INTRAVENOUS | Status: DC

## 2020-12-04 NOTE — Progress Notes (Signed)
Patient here for oncology follow-up appointment,  concerns of slight neuropathy and fatigue

## 2020-12-04 NOTE — Patient Instructions (Signed)

## 2020-12-04 NOTE — Progress Notes (Signed)
Hematology/Oncology Consult note [facility] [phone] [phone]   Patient Care Team: Center, [facility] as PCP - General (General Practice)  REFERRING PROVIDER: [facility]  CHIEF COMPLAINTS/REASON FOR VISIT:  Evaluation of iron deficiency anemia  HISTORY OF PRESENTING ILLNESS:  Melanie Olson is a  50 y.o.  female with PMH listed below was seen in consultation at the request of [facility] for evaluation of iron deficiency anemia.   03/21/2020-03/24/2020 patient was recently admitted due to abdominal pain.   Patient has been seen by gastroenterology and EGD showed 8 mm ulcer at the gastrojejunal anastomosis.  Patient was recommended to continue Protonix twice daily as well as Carafate.  Patient was discharged and she returned to emergency room on 03/27/2020 due to worsening of abdominal pain. She underwent a repeat CT scan which revealed some increasing stranding and inflammation near the GJ anastomotic line with what appeared to be a possible contained perforation of the ulcer noted on her prior upper endoscopy.  No free fluid or free air was definitively noted.  Patient was treated with empiric IV antibiotics and antifungals.  Underwent upper GI barium swallow which did not show any perforation. Patient was also found to have anemia with iron deficiency.  03/21/2020, hemoglobin 6.7.  Status post PRBC transfusions in the hospital.  Patient was referred to establish care with hematology for treatment of iron deficiency anemia.  Patient denies any abdominal pain today.  Since discharge, she has felt improvement of her fatigue.  Denies any black or bloody bowel movement.  Reports that menses is heavy sometimes.  INTERVAL HISTORY Melanie Olson is a 50 y.o. female with history of iron deficiency who returns to clinic for discussion of labs results and consideration of IV iron. She complains of  fatigue which is progressively worsening. Otherwise feels well. Denies any neurologic complaints. Denies recent fevers or illnesses. Denies any easy bleeding or bruising. No melena or hematochezia. No pica or restless leg. Reports good appetite and denies weight loss. Denies chest pain. Denies any nausea, vomiting, constipation, or diarrhea. Denies urinary complaints. Patient offers no further specific complaints today.  Review of Systems  Constitutional:  Positive for fatigue. Negative for appetite change, chills and fever.  HENT:   Negative for hearing loss and voice change.   Eyes:  Negative for eye problems.  Respiratory:  Negative for chest tightness and cough.   Cardiovascular:  Negative for chest pain.  Gastrointestinal:  Negative for abdominal distention, abdominal pain and blood in stool.  Endocrine: Negative for hot flashes.  Genitourinary:  Negative for difficulty urinating and frequency.   Musculoskeletal:  Negative for arthralgias and myalgias.  Skin:  Negative for itching and rash.  Neurological:  Negative for extremity weakness.  Hematological:  Negative for adenopathy.  Psychiatric/Behavioral:  Negative for confusion.    MEDICAL HISTORY:  Past Medical History:  Diagnosis Date   Abdominal wall ulcer (HCC)    Depression    GERD (gastroesophageal reflux disease)    HSV (herpes simplex virus) infection    IDA (iron deficiency anemia) 04/08/2020   Migraine     SURGICAL HISTORY: Past Surgical History:  Procedure Laterality Date   CHOLECYSTECTOMY     COLONOSCOPY WITH PROPOFOL N/A 07/11/2019   Procedure: COLONOSCOPY WITH PROPOFOL;  Surgeon: Pasty Spillers, MD;  Location: [facility] ENDOSCOPY;  Service: Endoscopy;  Laterality: N/A;   ESOPHAGOGASTRODUODENOSCOPY (EGD) WITH PROPOFOL N/A 07/11/2019   Procedure: ESOPHAGOGASTRODUODENOSCOPY (EGD) WITH PROPOFOL;  Surgeon: Pasty Spillers, MD;  Location: [facility] ENDOSCOPY;  Service: Endoscopy;  Laterality: N/A;    ESOPHAGOGASTRODUODENOSCOPY (EGD) WITH PROPOFOL N/A 03/23/2020   Procedure: ESOPHAGOGASTRODUODENOSCOPY (EGD) WITH PROPOFOL;  Surgeon: Pasty Spillers, MD;  Location: [facility] ENDOSCOPY;  Service: Endoscopy;  Laterality: N/A;   GASTRIC BYPASS  2006   tummy tuck      SOCIAL HISTORY: Social History   Socioeconomic History   Marital status: Significant Other    Spouse name: Not on file   Number of children: 2   Years of education: Not on file   Highest education level: Not on file  Occupational History   Not on file  Tobacco Use   Smoking status: Former    Years: 5.00    Types: Cigarettes    Quit date: 04/08/2008    Years since quitting: 12.6   Smokeless tobacco: Never   Tobacco comments:    social smoking on and off for 5 years   Vaping Use   Vaping Use: Never used  Substance and Sexual Activity   Alcohol use: No   Drug use: No   Sexual activity: Not on file  Other Topics Concern   Not on file  Social History Narrative   Not on file   Social Determinants of Health   Financial Resource Strain: Not on file  Food Insecurity: Not on file  Transportation Needs: Not on file  Physical Activity: Not on file  Stress: Not on file  Social Connections: Not on file  Intimate Partner Violence: Not on file    FAMILY HISTORY: Family History  Problem Relation Age of Onset   High Cholesterol Mother    Stroke Father    Aneurysm Father    Diabetes Maternal Grandmother    Heart disease Paternal Grandmother     ALLERGIES:  is allergic to penicillins.  MEDICATIONS:  Current Outpatient Medications  Medication Sig Dispense Refill   acetaminophen (TYLENOL) 325 MG tablet Take 2 tablets (650 mg total) by mouth every 6 (six) hours as needed for mild pain (or Fever >/= 101).     acyclovir (ZOVIRAX) 200 MG capsule Take 2 capsules by mouth in the morning and at bedtime.     amitriptyline (ELAVIL) 25 MG tablet Take 25 mg by mouth at bedtime.     calcium carbonate (TUMS EX) 750 MG chewable  tablet Chew 1 tablet by mouth daily.     cyclobenzaprine (FLEXERIL) 10 MG tablet Take 10 mg by mouth at bedtime as needed for muscle spasms.     Multiple Vitamin (MULTIVITAMIN) tablet Take 1 tablet by mouth daily. bariatric vitamins     pantoprazole (PROTONIX) 40 MG tablet Take 1 tablet (40 mg total) by mouth 2 (two) times daily for 180 doses. 90 tablet 0   buPROPion (WELLBUTRIN XL) 150 MG 24 hr tablet Take 150 mg by mouth daily. (Patient not taking: No sig reported)     sucralfate (CARAFATE) 1 GM/10ML suspension Take 10 mLs (1 g total) by mouth 4 (four) times daily -  with meals and at bedtime. (Patient not taking: No sig reported) 420 mL 2   No current facility-administered medications for this visit.    PHYSICAL EXAMINATION: ECOG PERFORMANCE STATUS: 1 - Symptomatic but completely ambulatory Vitals:   12/04/20 1349  BP: 135/83  Pulse: 77  Resp: 17  Temp: 99.1 F (37.3 C)  SpO2: 97%   Filed Weights   12/04/20 1349  Weight: 173 lb (78.5 kg)   Physical Exam Constitutional:  General: She is not in acute distress.    Appearance: She is well-developed. She is not ill-appearing.  HENT:     Mouth/Throat:     Pharynx: No oropharyngeal exudate.  Eyes:     General: No scleral icterus. Cardiovascular:     Rate and Rhythm: Normal rate and regular rhythm.  Pulmonary:     Effort: Pulmonary effort is normal.     Breath sounds: Normal breath sounds.  Abdominal:     General: There is no distension.     Palpations: Abdomen is soft.     Tenderness: There is no abdominal tenderness. There is no rebound.  Musculoskeletal:        General: No tenderness or deformity.     Right lower leg: No edema.     Left lower leg: No edema.  Skin:    General: Skin is warm and dry.     Coloration: Skin is not pale.  Neurological:     General: No focal deficit present.     Mental Status: She is alert and oriented to person, place, and time.     Motor: No weakness.  Psychiatric:        Mood and  Affect: Mood normal.        Behavior: Behavior normal.   CBC EXTENDED Latest Ref Rng & Units 12/01/2020 06/03/2020 04/01/2020  WBC 4.0 - 10.5 K/uL 4.7 4.6 3.8(L)  RBC 3.87 - 5.11 MIL/uL 4.42 5.02 5.02  HGB 12.0 - 15.0 g/dL 30.0 92.3 10.9(L)  HCT 36.0 - 46.0 % 41.0 43.9 37.7  PLT 150 - 400 K/uL 264 256 488(H)  NEUTROABS 1.7 - 7.7 K/uL 2.9 2.9 2.7  LYMPHSABS 0.7 - 4.0 K/uL 1.2 1.3 0.6(L)   CMP Latest Ref Rng & Units 12/01/2020 04/01/2020 03/31/2020  Glucose 70 - 99 mg/dL 95 98 74  BUN 6 - 20 mg/dL 20 <3(A) <0(T)  Creatinine 0.44 - 1.00 mg/dL 6.22 6.33 3.54  Sodium 135 - 145 mmol/L 136 139 138  Potassium 3.5 - 5.1 mmol/L 3.9 3.9 3.3(L)  Chloride 98 - 111 mmol/L 103 102 102  CO2 22 - 32 mmol/L 27 26 26   Calcium 8.9 - 10.3 mg/dL ) 5.6(Y) 5.6(L)  Total Protein 6.5 - 8.1 g/dL 6.9 - -  Total Bilirubin 0.3 - 1.2 mg/dL 0.8 - -  Alkaline Phos 38 - 126 U/L 67 - -  AST 15 - 41 U/L 16 - -  ALT 0 - 44 U/L 18 - -   Iron/TIBC/Ferritin/ %Sat    Component Value Date/Time   IRON 60 12/01/2020 1322   TIBC 412 12/01/2020 1322   FERRITIN 6 (L) 12/01/2020 1322   IRONPCTSAT 15 12/01/2020 1322     ASSESSMENT & PLAN:  1. Other iron deficiency anemia    Iron deficiency- secondary to gastric bypass. Hemoglobin is stable and she is not anemia. However ferritin is low at 6, iron saturation 15%. Consistent with underlying iron deficiency. Proceed with IV venofer x 4. Again reviewed that in context of gastric bypass she will likely need maintenance venofer periodically. Will plan to check folate, b12 next visit as well.   Venofer x 4 RTC in 6 months for labs then follow up with Dr 12/03/2020 and possible venofer a few days later.   Orders Placed This Encounter  Procedures   Vitamin B12    Standing Status:   Future    Standing Expiration Date:   12/04/2021   Folate    Standing Status:   Future  Standing Expiration Date:   12/04/2021   Comprehensive metabolic panel    Standing Status:   Future     Standing Expiration Date:   12/04/2021   CBC with Differential/Platelet    Standing Status:   Future    Standing Expiration Date:   12/04/2021   Iron and TIBC    Standing Status:   Future    Standing Expiration Date:   12/04/2021   Ferritin    Standing Status:   Future    Standing Expiration Date:   12/04/2021    All questions were answered. The patient knows to call the clinic with any problems questions or concerns.  Consuello Masse, DNP, AGNP-C [facility] [phone] (clinic) 12/04/2020

## 2020-12-10 ENCOUNTER — Other Ambulatory Visit: Payer: Self-pay

## 2020-12-10 ENCOUNTER — Inpatient Hospital Stay: Payer: Self-pay | Attending: Oncology

## 2020-12-10 VITALS — BP 116/72 | HR 68 | Temp 97.9°F | Resp 16

## 2020-12-10 DIAGNOSIS — E611 Iron deficiency: Secondary | ICD-10-CM | POA: Insufficient documentation

## 2020-12-10 DIAGNOSIS — D508 Other iron deficiency anemias: Secondary | ICD-10-CM

## 2020-12-10 DIAGNOSIS — Z9884 Bariatric surgery status: Secondary | ICD-10-CM | POA: Insufficient documentation

## 2020-12-10 MED ORDER — SODIUM CHLORIDE 0.9 % IV SOLN: 200.0000 mg | Freq: Once | INTRAVENOUS | Status: DC

## 2020-12-10 MED ORDER — SODIUM CHLORIDE 0.9 % IV SOLN
Freq: Once | INTRAVENOUS | Status: AC
  Filled 2020-12-10: qty 250

## 2020-12-10 MED ORDER — IRON SUCROSE 20 MG/ML IV SOLN
200.0000 mg | Freq: Once | INTRAVENOUS | Status: AC
  Administered 2020-12-10: 200 mg via INTRAVENOUS
  Filled 2020-12-10: qty 10

## 2020-12-10 NOTE — Patient Instructions (Signed)

## 2020-12-17 ENCOUNTER — Inpatient Hospital Stay: Payer: Self-pay

## 2020-12-17 ENCOUNTER — Other Ambulatory Visit: Payer: Self-pay

## 2020-12-17 VITALS — BP 121/83 | HR 71 | Temp 97.9°F | Resp 20

## 2020-12-17 DIAGNOSIS — D508 Other iron deficiency anemias: Secondary | ICD-10-CM

## 2020-12-17 MED ORDER — SODIUM CHLORIDE 0.9 % IV SOLN: 200.0000 mg | Freq: Once | INTRAVENOUS | Status: DC

## 2020-12-17 MED ORDER — SODIUM CHLORIDE 0.9 % IV SOLN
INTRAVENOUS | Status: DC
  Filled 2020-12-17: qty 250

## 2020-12-17 MED ORDER — IRON SUCROSE 20 MG/ML IV SOLN
200.0000 mg | Freq: Once | INTRAVENOUS | Status: AC
  Administered 2020-12-17: 200 mg via INTRAVENOUS
  Filled 2020-12-17: qty 10

## 2020-12-17 NOTE — Patient Instructions (Signed)

## 2020-12-25 ENCOUNTER — Inpatient Hospital Stay: Payer: Self-pay

## 2020-12-25 ENCOUNTER — Other Ambulatory Visit: Payer: Self-pay

## 2020-12-25 VITALS — BP 118/82 | HR 71 | Temp 97.0°F | Resp 20

## 2020-12-25 DIAGNOSIS — D508 Other iron deficiency anemias: Secondary | ICD-10-CM

## 2020-12-25 MED ORDER — SODIUM CHLORIDE 0.9 % IV SOLN: 200.0000 mg | Freq: Once | INTRAVENOUS | Status: DC

## 2020-12-25 MED ORDER — IRON SUCROSE 20 MG/ML IV SOLN
200.0000 mg | Freq: Once | INTRAVENOUS | Status: AC
  Administered 2020-12-25: 200 mg via INTRAVENOUS
  Filled 2020-12-25: qty 10

## 2020-12-25 MED ORDER — SODIUM CHLORIDE 0.9 % IV SOLN
Freq: Once | INTRAVENOUS | Status: AC
  Filled 2020-12-25: qty 250

## 2020-12-25 NOTE — Patient Instructions (Signed)

## 2020-12-25 NOTE — Progress Notes (Signed)
Pt reports experiencing constipation, with last decent BM being at least a few weeks ago. Pt states she has tried laxatives, stool softeners and an enema with little to no relief. Consuello Masse NP aware. Per Consuello Masse proceed with Venofer, pt to take 2 Senokot and 17 grams (one capful) of Miralax every 15 minutes until pt has a BM once at home. If no BM after the completion of one bottle of Miralax, pt to be evaluated at ER. Pt to consult with PCP about Constipation management if symptoms continue to persist.  Written and verbal instructions given to pt. Pt verbalizes understanding.

## 2021-06-02 ENCOUNTER — Inpatient Hospital Stay: Payer: Self-pay | Attending: Oncology

## 2021-06-02 DIAGNOSIS — Z9884 Bariatric surgery status: Secondary | ICD-10-CM | POA: Insufficient documentation

## 2021-06-02 DIAGNOSIS — F17210 Nicotine dependence, cigarettes, uncomplicated: Secondary | ICD-10-CM | POA: Insufficient documentation

## 2021-06-02 DIAGNOSIS — Z79899 Other long term (current) drug therapy: Secondary | ICD-10-CM | POA: Insufficient documentation

## 2021-06-02 DIAGNOSIS — D508 Other iron deficiency anemias: Secondary | ICD-10-CM | POA: Insufficient documentation

## 2021-06-02 LAB — COMPREHENSIVE METABOLIC PANEL
ALT: 18 U/L (ref 0–44)
AST: 18 U/L (ref 15–41)
Albumin: 4.2 g/dL (ref 3.5–5.0)
Alkaline Phosphatase: 77 U/L (ref 38–126)
Anion gap: 7 (ref 5–15)
BUN: 17 mg/dL (ref 6–20)
CO2: 28 mmol/L (ref 22–32)
Calcium: 9 mg/dL (ref 8.9–10.3)
Chloride: 100 mmol/L (ref 98–111)
Creatinine, Ser: 0.59 mg/dL (ref 0.44–1.00)
GFR, Estimated: >60 mL/min (ref >60)
Glucose, Bld: 122 mg/dL — ABNORMAL HIGH (ref 70–99)
Potassium: 3.9 mmol/L (ref 3.5–5.1)
Sodium: 135 mmol/L (ref 135–145)
Total Bilirubin: 0.5 mg/dL (ref 0.3–1.2)
Total Protein: 7.3 g/dL (ref 6.5–8.1)

## 2021-06-02 LAB — CBC WITH DIFFERENTIAL/PLATELET
Abs Immature Granulocytes: 0.01 10*3/uL (ref 0.00–0.07)
Basophils Absolute: 0 10*3/uL (ref 0.0–0.1)
Basophils Relative: 0 %
Eosinophils Absolute: 0.1 10*3/uL (ref 0.0–0.5)
Eosinophils Relative: 3 %
HCT: 45.6 % (ref 36.0–46.0)
Hemoglobin: 15.1 g/dL — ABNORMAL HIGH (ref 12.0–15.0)
Immature Granulocytes: 0 %
Lymphocytes Relative: 28 %
Lymphs Abs: 1.2 10*3/uL (ref 0.7–4.0)
MCH: 31.9 pg (ref 26.0–34.0)
MCHC: 33.1 g/dL (ref 30.0–36.0)
MCV: 96.4 fL (ref 80.0–100.0)
Monocytes Absolute: 0.3 10*3/uL (ref 0.1–1.0)
Monocytes Relative: 6 %
Neutro Abs: 2.8 10*3/uL (ref 1.7–7.7)
Neutrophils Relative %: 63 %
Platelets: 251 10*3/uL (ref 150–400)
RBC: 4.73 MIL/uL (ref 3.87–5.11)
RDW: 11.5 % (ref 11.5–15.5)
WBC: 4.5 10*3/uL (ref 4.0–10.5)
nRBC: 0 % (ref 0.0–0.2)

## 2021-06-02 LAB — IRON AND TIBC
Iron: 61 ug/dL (ref 28–170)
Saturation Ratios: 19 % (ref 10.4–31.8)
TIBC: 323 ug/dL (ref 250–450)
UIBC: 262 ug/dL

## 2021-06-02 LAB — VITAMIN B12: Vitamin B-12: 604 pg/mL (ref 180–914)

## 2021-06-02 LAB — FERRITIN: Ferritin: 60 ng/mL (ref 11–307)

## 2021-06-02 LAB — FOLATE: Folate: 18.8 ng/mL (ref >5.9)

## 2021-06-03 ENCOUNTER — Inpatient Hospital Stay (HOSPITAL_BASED_OUTPATIENT_CLINIC_OR_DEPARTMENT_OTHER): Payer: Self-pay | Admitting: Oncology

## 2021-06-03 ENCOUNTER — Inpatient Hospital Stay: Payer: Self-pay

## 2021-06-03 ENCOUNTER — Encounter: Payer: Self-pay | Admitting: Oncology

## 2021-06-03 VITALS — BP 128/88 | HR 86 | Temp 97.0°F | Resp 18 | Wt 180.9 lb

## 2021-06-03 DIAGNOSIS — Z9884 Bariatric surgery status: Secondary | ICD-10-CM

## 2021-06-03 DIAGNOSIS — D508 Other iron deficiency anemias: Secondary | ICD-10-CM

## 2021-06-03 NOTE — Progress Notes (Signed)
?Hematology/Oncology Progress note ?[phone] [phone] ?  ? ? ? ?Patient Care Team: ?[facility] as PCP - General (General Practice) ? ?REFERRING PROVIDER: ?[facility] ?CHIEF COMPLAINTS/REASON FOR VISIT:  ?iron deficiency anemia ? ?HISTORY OF PRESENTING ILLNESS:  ?Melanie Olson is a  51 y.o.  female with PMH listed below was seen in consultation at the request of [facility]  for evaluation of iron deficiency anemia.  ? ?03/21/2020-03/24/2020 patient was recently admitted due to abdominal pain.   ?Patient has been seen by gastroenterology and EGD showed 8 mm ulcer at the gastrojejunal anastomosis.  Patient was recommended to continue Protonix twice daily as well as Carafate. ?Patient was discharged and she returned to emergency room on 03/27/2020 due to worsening of abdominal pain. She underwent a repeat CT scan which revealed some increasing stranding and inflammation near the GJ anastomotic line with what appeared to be a possible contained perforation of the ulcer noted on her prior upper endoscopy.  No free fluid or free air was definitively noted.  Patient was treated with empiric IV antibiotics and antifungals.  Underwent upper GI barium swallow which did not show any perforation. ?Patient was also found to have anemia with iron deficiency. ?03/21/2020, hemoglobin 6.7.  Status post PRBC transfusions in the hospital. ? ?Patient was referred to establish care with hematology for treatment of iron deficiency anemia. ?Patient denies any abdominal pain today.  Since discharge, she has felt improvement of her fatigue.  Denies any black or bloody bowel movement. ? ?Reports that menses is heavy sometimes. ? ?INTERVAL HISTORY ?Melanie Olson is a 51 y.o. female who has above history reviewed by me today presents for follow up visit for management of Iron deficiency anemia ?Patient has received IV Venofer previously and tolerated well ?Today  she reports feeling well.  She has no new complaints. ? ? ?Review of Systems  ?Constitutional:  Negative for appetite change, chills, fatigue and fever.  ?HENT:   Negative for hearing loss and voice change.   ?Eyes:  Negative for eye problems.  ?Respiratory:  Negative for chest tightness and cough.   ?Cardiovascular:  Negative for chest pain.  ?Gastrointestinal:  Negative for abdominal distention, abdominal pain and blood in stool.  ?Endocrine: Negative for hot flashes.  ?Genitourinary:  Negative for difficulty urinating and frequency.   ?Musculoskeletal:  Negative for arthralgias.  ?Skin:  Negative for itching and rash.  ?Neurological:  Negative for extremity weakness.  ?Hematological:  Negative for adenopathy.  ?Psychiatric/Behavioral:  Negative for confusion.   ? ?MEDICAL HISTORY:  ?Past Medical History:  ?Diagnosis Date  ? Abdominal wall ulcer (HCC)   ? Depression   ? GERD (gastroesophageal reflux disease)   ? HSV (herpes simplex virus) infection   ? IDA (iron deficiency anemia) 04/08/2020  ? Migraine   ? ? ?SURGICAL HISTORY: ?Past Surgical History:  ?Procedure Laterality Date  ? CHOLECYSTECTOMY    ? COLONOSCOPY WITH PROPOFOL N/A 07/11/2019  ? Procedure: COLONOSCOPY WITH PROPOFOL;  Surgeon: Pasty Spillers, MD;  Location: [facility] ENDOSCOPY;  Service: Endoscopy;  Laterality: N/A;  ? ESOPHAGOGASTRODUODENOSCOPY (EGD) WITH PROPOFOL N/A 07/11/2019  ? Procedure: ESOPHAGOGASTRODUODENOSCOPY (EGD) WITH PROPOFOL;  Surgeon: Pasty Spillers, MD;  Location: [facility] ENDOSCOPY;  Service: Endoscopy;  Laterality: N/A;  ? ESOPHAGOGASTRODUODENOSCOPY (EGD) WITH PROPOFOL N/A 03/23/2020  ? Procedure: ESOPHAGOGASTRODUODENOSCOPY (EGD) WITH PROPOFOL;  Surgeon: Pasty Spillers, MD;  Location: [facility] ENDOSCOPY;  Service: Endoscopy;  Laterality: N/A;  ? GASTRIC BYPASS  2006  ? tummy tuck    ? ? ?SOCIAL HISTORY: ?Social History  ? ?Socioeconomic History  ? Marital status: Significant Other  ?  Spouse name: Not on file  ? Number of children:  2  ? Years of education: Not on file  ? Highest education level: Not on file  ?Occupational History  ? Not on file  ?Tobacco Use  ? Smoking status: Former  ?  Years: 5.00  ?  Types: Cigarettes  ?  Quit date: 04/08/2008  ?  Years since quitting: 13.1  ? Smokeless tobacco: Never  ? Tobacco comments:  ?  social smoking on and off for 5 years   ?Vaping Use  ? Vaping Use: Never used  ?Substance and Sexual Activity  ? Alcohol use: No  ? Drug use: No  ? Sexual activity: Not on file  ?Other Topics Concern  ? Not on file  ?Social History Narrative  ? Not on file  ? ?Social Determinants of Health  ? ?Financial Resource Strain: Not on file  ?Food Insecurity: Not on file  ?Transportation Needs: Not on file  ?Physical Activity: Not on file  ?Stress: Not on file  ?Social Connections: Not on file  ?Intimate Partner Violence: Not on file  ? ? ?FAMILY HISTORY: ?Family History  ?Problem Relation Age of Onset  ? High Cholesterol Mother   ? Stroke Father   ? Aneurysm Father   ? Diabetes Maternal Grandmother   ? Heart disease Paternal Grandmother   ? ? ?ALLERGIES:  is allergic to penicillins. ? ?MEDICATIONS:  ?Current Outpatient Medications  ?Medication Sig Dispense Refill  ? acetaminophen (TYLENOL) 325 MG tablet Take 2 tablets (650 mg total) by mouth every 6 (six) hours as needed for mild pain (or Fever >/= 101).    ? acyclovir (ZOVIRAX) 200 MG capsule Take 2 capsules by mouth in the morning and at bedtime.    ? amitriptyline (ELAVIL) 25 MG tablet Take 25 mg by mouth at bedtime.    ? cyclobenzaprine (FLEXERIL) 10 MG tablet Take 10 mg by mouth at bedtime as needed for muscle spasms.    ? Multiple Vitamin (MULTIVITAMIN) tablet Take 1 tablet by mouth daily. bariatric vitamins    ? pantoprazole (PROTONIX) 40 MG tablet Take 1 tablet (40 mg total) by mouth 2 (two) times daily for 180 doses. 90 tablet 0  ? sucralfate (CARAFATE) 1 GM/10ML suspension Take 10 mLs (1 g total) by mouth 4 (four) times daily -  with meals and at bedtime. 420 mL 2   ? ?No current facility-administered medications for this visit.  ? ? ? ?PHYSICAL EXAMINATION: ?ECOG PERFORMANCE STATUS: 1 - Symptomatic but completely ambulatory ?Vitals:  ? 06/03/21 1539  ?BP: 128/88  ?Pulse: 86  ?Resp: 18  ?Temp: (!) 97 ?F (36.1 ?C)  ? ?Filed Weights  ? 06/03/21 1539  ?Weight: 180 lb 14.4 oz (82.1 kg)  ? ? ?Physical Exam ?Constitutional:   ?   General: She is not in acute distress. ?HENT:  ?   Head: Normocephalic and atraumatic.  ?Eyes:  ?   General: No scleral icterus. ?Cardiovascular:  ?   Rate and Rhythm: Normal rate and regular rhythm.  ?   Heart sounds: Normal heart sounds.  ?Pulmonary:  ?   Effort: Pulmonary effort is normal. No respiratory distress.  ?   Breath sounds: No wheezing.  ?Abdominal:  ?   General: Bowel sounds are normal. There is no distension.  ?   Palpations: Abdomen is soft.  ?  Musculoskeletal:     ?   General: No deformity. Normal range of motion.  ?   Cervical back: Normal range of motion and neck supple.  ?Skin: ?   General: Skin is warm and dry.  ?   Findings: No erythema or rash.  ?Neurological:  ?   Mental Status: She is alert and oriented to person, place, and time. Mental status is at baseline.  ?   Cranial Nerves: No cranial nerve deficit.  ?   Coordination: Coordination normal.  ?Psychiatric:     ?   Mood and Affect: Mood normal.  ? ? ?LABORATORY DATA:  ?I have reviewed the data as listed ?Lab Results  ?Component Value Date  ? WBC 4.5 06/02/2021  ? HGB 15.1 (H) 06/02/2021  ? HCT 45.6 06/02/2021  ? MCV 96.4 06/02/2021  ? PLT 251 06/02/2021  ? ?Recent Labs  ?  12/01/20 ?1322 06/02/21 ?1549  ?NA 136 135  ?K 3.9 3.9  ?CL 103 100  ?CO2 27 28  ?GLUCOSE 95 122*  ?BUN 20 17  ?CREATININE 0.53 0.59  ?CALCIUM 8.7* 9.0  ?GFRNONAA >60 >60  ?PROT 6.9 7.3  ?ALBUMIN 3.9 4.2  ?AST 16 18  ?ALT 18 18  ?ALKPHOS 67 77  ?BILITOT 0.8 0.5  ? ? ?Iron/TIBC/Ferritin/ %Sat ?   ?Component Value Date/Time  ? IRON 61 06/02/2021 1549  ? TIBC 323 06/02/2021 1549  ? FERRITIN 60 06/02/2021 1549  ?  IRONPCTSAT 19 06/02/2021 1549  ?  ? ?RADIOGRAPHIC STUDIES: ?I have personally reviewed the radiological images as listed and agreed with the findings in the report. ?No results found. ? ? ? ? ?ASSESSMENT &

## 2021-09-24 ENCOUNTER — Ambulatory Visit: Admit: 2021-09-24 | Discharge: 2021-09-24 | Attending: Adult Health

## 2021-09-24 DIAGNOSIS — K912 Postsurgical malabsorption, not elsewhere classified: Secondary | ICD-10-CM

## 2021-09-24 NOTE — Progress Notes (Signed)
Madison Jackson  10/25/2021  Madison Jackson    Roux-en- Y Gastric Bypass  17 year Post-Operative Follow-up     Chief Complaint   Patient presents with    Follow Up After Procedure     17 yr LRYGB. Water intake is around 64 oz daily. Protein through shakes and foods. Vitamin intake is good. Bowel movements are good.       Madison Jackson is a 51 y.o. 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  Reports that she has had issues with a bleeding ulcer in 2022. She did follow up with a GI doctor that has helped her. She does get iron infusions every 6 months.She tells me that she was told she had some type of  "protrusion" around the intestine where she had the anastomosis. Tells me that they thought her intestines were going to explode. Notes are in Care Everywhere as well as labs.  Prior to this she was not having bowel movements, was having abdominal discomfort.      She is not having swallowing difficulty. Patient does report vomiting, occasionally after she eats.  Patient denies gastric reflux symptoms. Bowel movements are now normal per patient. Patient is not taking fiber supplements.    Patient is compliant most of the time with the bariatric diet . She is meeting fluid recommendations of at least 64 ounces per day and is meeting protein recommendations. She  is exercising:  walking 1/2 hr daily on lunch for the past couple of months .        Weight=181 lb (82.1 kg)  Today's weight represents a total weight loss of 47 pounds since surgery with 46 pounds regained since the lowest weight.  She reports that her lowest weight after surgery was 135.       Breakfast: coffee protein drink  Snack - 1/2 cup fruit, occasionally bacon or cheese nip crackers  Lunch: general pso's frozen chicken, about 6 pieces in the toaster oven  Snack: chocolate protein shake  Dinner: soft taco, chicken casserole     She is most interested in talking about what she needs to do to lose  weight. She now lives in [place] and is not here for regular visits or follow ups.     Prior to Admission medications    Medication Sig Start Date End Date Taking? Authorizing Provider   acetaminophen (TYLENOL) 325 MG tablet Take 2 tablets by mouth every 6 hours as needed 04/01/20  Yes Historical Provider, MD   acyclovir (ZOVIRAX) 200 MG capsule Take 2 capsules by mouth daily 08/05/11  Yes Historical Provider, MD   amitriptyline (ELAVIL) 25 MG tablet Take 1 tablet by mouth nightly 12/16/13  Yes Historical Provider, MD   gabapentin (NEURONTIN) 300 MG capsule Take one capsule ( 300 mg) twice a day for 30 days 06/30/18  Yes Historical Provider, MD   cyclobenzaprine (FLEXERIL) 10 MG tablet Take 1 tablet by mouth nightly as needed 01/05/15  Yes Historical Provider, MD   Ferrous Sulfate ER 50 MG TBCR Take by mouth 06/01/19  Yes Historical Provider, MD   Multiple Vitamin (MULTIVITAMIN) tablet Take 1 tablet by mouth daily   Yes Historical Provider, MD   pantoprazole (PROTONIX) 40 MG tablet Take 1 tablet by mouth 09/04/12  Yes Historical Provider, MD   sucralfate (CARAFATE) 1 GM/10ML suspension Take 10 mLs by mouth  04/14/20  Yes Historical Provider, MD        Allergies   Allergen Reactions    Penicillins Rash         History reviewed. No pertinent past medical history.    History reviewed. No pertinent surgical history.    Current Outpatient Medications   Medication Sig Dispense Refill    acetaminophen (TYLENOL) 325 MG tablet Take 2 tablets by mouth every 6 hours as needed      acyclovir (ZOVIRAX) 200 MG capsule Take 2 capsules by mouth daily      amitriptyline (ELAVIL) 25 MG tablet Take 1 tablet by mouth nightly      gabapentin (NEURONTIN) 300 MG capsule Take one capsule ( 300 mg) twice a day for 30 days      cyclobenzaprine (FLEXERIL) 10 MG tablet Take 1 tablet by mouth nightly as needed      Ferrous Sulfate ER 50 MG TBCR Take by mouth      Multiple Vitamin (MULTIVITAMIN) tablet Take 1 tablet by mouth daily       pantoprazole (PROTONIX) 40 MG tablet Take 1 tablet by mouth      sucralfate (CARAFATE) 1 GM/10ML suspension Take 10 mLs by mouth       No current facility-administered medications for this visit.       Allergies   Allergen Reactions    Penicillins Rash       History reviewed. No pertinent family history.    Social History     Socioeconomic History    Marital status: Single     Spouse name: Not on file    Number of children: Not on file    Years of education: Not on file    Highest education level: Not on file   Occupational History    Not on file   Tobacco Use    Smoking status: Some Days     Types: Cigarettes    Smokeless tobacco: Never   Substance and Sexual Activity    Alcohol use: Not on file    Drug use: Not on file    Sexual activity: Not on file   Other Topics Concern    Not on file   Social History Narrative    Not on file     Social Determinants of Health     Financial Resource Strain: Not on file   Food Insecurity: Not on file   Transportation Needs: Not on file   Physical Activity: Not on file   Stress: Not on file   Social Connections: Not on file   Intimate Partner Violence: Not on file   Housing Stability: Not on file       A complete 10 system review was performed and are otherwise negative unless mentioned in the above HPI. Specific negatives are listed below but may not include all those reviewed.    General ROS: negative obtundation, AMS  ENT ROS: negative rhinorrhea, epistaxis  Allergy and Immunology ROS: negative itchy/watery eyes or nasal congestion  Hematological and Lymphatic ROS: negative spontaneous bleeding or bruising  Endocrine ROS: negative  lethargy, mood swings, palpitations or polydipsia/polyuria  Respiratory ROS: negative sputum changes, stridor, tachypnea or wheezing  Cardiovascular ROS: negative for - loss of consciousness, murmur or orthopnea  Gastrointestinal ROS: negative for - hematochezia or hematemesis  Genito-Urinary ROS: negative for -  genital discharge or  hematuria  Musculoskeletal ROS: negative for - focal weakness, gangrene  Psych/Neuro ROS: negative for - visual or auditory hallucinations, suicidal ideation  Physical exam:   BP (!) 140/84 (Site: Left Lower Arm, Position: Sitting, Cuff Size: Medium Adult)   Pulse 77   Temp 97.9 F (36.6 C) (Temporal)   Resp 20   Ht 5\' 2"  (1.575 m)   Wt 181 lb (82.1 kg)   BMI 33.11 kg/m    General appearance: alert, appears stated age and cooperative  Head: Normocephalic, without obvious abnormality, atraumatic  Neck: no adenopathy, no carotid bruit, no JVD, supple, symmetrical, trachea midline and thyroid not enlarged, symmetric, no tenderness/mass/nodules  Lungs: clear to auscultation bilaterally  Heart: regular rate and rhythm  Abdomen: soft, non-tender; bowel sounds normal; no masses,  no organomegaly  Extremities: extremities normal, atraumatic, no cyanosis or edema    Assessment: Post Roux-en- Y Gastric Bypass.  She does not complain of GERD,  does not have sleep apnea,  does not have diabetes,  does not have hypertension off medical treatment.   Cholesterol and triglycerides are normal.    1. Malnutrition following gastrointestinal surgery  See below  Patient is following with a doctor in for her labs    2. Weight gain status post gastric bypass  Discussed in length lifestyle and dietary interventions  Patient and husband were thankful for the information. Unfortunately the patient cannot follow with our dieticians since [place].   She will contact the office when she wants to come back in.       Plan:  Continue to eat a high protein, low calorie diet, eat small portions very slowly and chew well before swallowing.  Drink plenty of water and fluids. Make sure to use fiber to keep the bowels regular. Try to exercise 7 days per week, maintain adequate variety and balance. Always notify the clinic if you have any medical problems. Follow up in 12 months, or sooner if  needed.      Physician Signature: Electronically signed by [place], APRN - CNP

## 2021-09-24 NOTE — Patient Instructions (Addendum)
Please continue to take your vitamin and mineral supplements as instructed.      Meal times    Protein: 20-30 grams per meal time  Carbohydrates: 20 grams per meal time   Added sugars - no more than 5 grams per serving  Fat: 10 grams per meal time    Snacks - hard boiled eggs, nuts, string cheese, deli meat, cottage cheese, greek yogurt     PROTEIN AND PRODUCE      ELLEN LUDWIG ON IN[employer]    If you received a blood work prescription today for laboratory monitoring due prior to your next routine follow-up visit, please have this blood work obtained 10 to 14 days prior to your next visit.  It is important to fast for 12 hours prior to routine weight loss surgery blood work, EXCEPT for drinking water, to ensure accuracy of results.    Please report nausea, vomiting, abdominal pain, or any other problems you experience to your surgeon.  For problems related to weight loss surgery, it is best to go to [facility]. [facility] and have your surgeon paged.        Breakfast -     one high protein shake                            + 20 grams of fiber. Do this by either eating 12 tablespoons of the original, plain Fiber One cereal every day or 4 tablespoons of wheat dextrin powder (Benefiber or a generic brand) every day. Work up to this amount slowly by starting with only one-eighth to one-fourth of the target amount and then adding another one-eighth to one-fourth every one or two weeks until reaching the target.     Lunch -           one high protein shake                           + one a fat snack item     Dinner -          one frozen meal                           + one snack item     Shake options (<200 cal, >25 grams/protein) :  Nectar, Pure Protein, Premier, Fairlife, Boost Max, [employer] Max, BeneProtein and [employer] (which is lactose-free) are milk-based options; Nectar, Premier Protein Clear, IsoPure Protein Drink, and Protein 2 O are water-based options; (Premier Protein Clear, the water-based  option, comes in a 20 oz bottle with 20 grams of protein and 90 calories. So you have to drink three each day which increases the cost.)  (Disclaimer: Dietary supplements rarely have their listed ingredients and the amount of each verified by a third party other. Sometimes they give verification for their claims to be GMO and gluten free and to be organic. However, even such verifications as these may still be untrustworthy.)     Fat snack options (<150 cal, >11 grams of fat): 22 almonds or cashews, 1 1/2 tablespoon of a oil-based dressing or 4 tablespoons of [place] dressing on a bed of salad greens, 1 1/2 tablespoons of peanut butter, 1 Cranberry Almond [employer] options (<100 cal, no sweets): fruit, low fat/high protein [place] yogurt, mozzarella cheese stick, nuts, salad with dressing, peanut  butter, chips/crackers/pretzels     Frozen meal options (<350 cal):  Weight Watchers Smart Ones, [employer] Cuisine, Healthy Choice, Michelina's, Amy's, etc     Food substitutes for the shakes:  4 oz of baked, grilled or broiled chicken, [place] or fish, 10 egg whites    Drink at least 64 oz of water each day     Take a multivitamin daily     Walk 30 min every day

## 2021-12-08 ENCOUNTER — Inpatient Hospital Stay: Payer: Self-pay | Attending: Oncology

## 2021-12-08 DIAGNOSIS — D509 Iron deficiency anemia, unspecified: Secondary | ICD-10-CM | POA: Insufficient documentation

## 2021-12-08 DIAGNOSIS — Z9884 Bariatric surgery status: Secondary | ICD-10-CM | POA: Insufficient documentation

## 2021-12-08 DIAGNOSIS — D508 Other iron deficiency anemias: Secondary | ICD-10-CM

## 2021-12-08 LAB — CBC WITH DIFFERENTIAL/PLATELET
Abs Immature Granulocytes: 0 10*3/uL (ref 0.00–0.07)
Basophils Absolute: 0 10*3/uL (ref 0.0–0.1)
Basophils Relative: 1 %
Eosinophils Absolute: 0.1 10*3/uL (ref 0.0–0.5)
Eosinophils Relative: 3 %
HCT: 46.1 % — ABNORMAL HIGH (ref 36.0–46.0)
Hemoglobin: 15 g/dL (ref 12.0–15.0)
Immature Granulocytes: 0 %
Lymphocytes Relative: 30 %
Lymphs Abs: 1.2 10*3/uL (ref 0.7–4.0)
MCH: 30.8 pg (ref 26.0–34.0)
MCHC: 32.5 g/dL (ref 30.0–36.0)
MCV: 94.7 fL (ref 80.0–100.0)
Monocytes Absolute: 0.3 10*3/uL (ref 0.1–1.0)
Monocytes Relative: 9 %
Neutro Abs: 2.3 10*3/uL (ref 1.7–7.7)
Neutrophils Relative %: 57 %
Platelets: 277 10*3/uL (ref 150–400)
RBC: 4.87 MIL/uL (ref 3.87–5.11)
RDW: 11.9 % (ref 11.5–15.5)
WBC: 3.9 10*3/uL — ABNORMAL LOW (ref 4.0–10.5)
nRBC: 0 % (ref 0.0–0.2)

## 2021-12-08 LAB — COMPREHENSIVE METABOLIC PANEL
ALT: 30 U/L (ref 0–44)
AST: 25 U/L (ref 15–41)
Albumin: 4.3 g/dL (ref 3.5–5.0)
Alkaline Phosphatase: 79 U/L (ref 38–126)
Anion gap: 8 (ref 5–15)
BUN: 19 mg/dL (ref 6–20)
CO2: 26 mmol/L (ref 22–32)
Calcium: 9.1 mg/dL (ref 8.9–10.3)
Chloride: 104 mmol/L (ref 98–111)
Creatinine, Ser: 0.47 mg/dL (ref 0.44–1.00)
GFR, Estimated: >60 mL/min (ref >60)
Glucose, Bld: 108 mg/dL — ABNORMAL HIGH (ref 70–99)
Potassium: 4.1 mmol/L (ref 3.5–5.1)
Sodium: 138 mmol/L (ref 135–145)
Total Bilirubin: 0.5 mg/dL (ref 0.3–1.2)
Total Protein: 7.7 g/dL (ref 6.5–8.1)

## 2021-12-08 LAB — VITAMIN B12: Vitamin B-12: 583 pg/mL (ref 180–914)

## 2021-12-08 LAB — IRON AND TIBC
Iron: 58 ug/dL (ref 28–170)
Saturation Ratios: 16 % (ref 10.4–31.8)
TIBC: 367 ug/dL (ref 250–450)
UIBC: 309 ug/dL

## 2021-12-08 LAB — FOLATE: Folate: 32 ng/mL (ref >5.9)

## 2021-12-08 LAB — FERRITIN: Ferritin: 58 ng/mL (ref 11–307)

## 2021-12-08 MED FILL — Iron Sucrose Inj 20 MG/ML (Fe Equiv): INTRAVENOUS | Qty: 10 | Status: AC

## 2021-12-09 ENCOUNTER — Inpatient Hospital Stay: Payer: Self-pay

## 2021-12-09 ENCOUNTER — Encounter: Payer: Self-pay | Admitting: Oncology

## 2021-12-09 ENCOUNTER — Inpatient Hospital Stay (HOSPITAL_BASED_OUTPATIENT_CLINIC_OR_DEPARTMENT_OTHER): Payer: Self-pay | Admitting: Oncology

## 2021-12-09 VITALS — BP 123/78 | HR 78 | Temp 98.9°F | Resp 18 | Wt 183.8 lb

## 2021-12-09 DIAGNOSIS — Z9884 Bariatric surgery status: Secondary | ICD-10-CM | POA: Insufficient documentation

## 2021-12-09 DIAGNOSIS — D508 Other iron deficiency anemias: Secondary | ICD-10-CM

## 2021-12-09 NOTE — Progress Notes (Signed)
Hematology/Oncology Progress note [phone] [phone]      Patient Care Team: Center, [facility] as PCP - General (General Practice) Rickard Patience, MD as Consulting Physician (Oncology)  ASSESSMENT & PLAN:   IDA (iron deficiency anemia) #Iron deficiency anemia in the context of gastric bypass Labs reviewed and discussed with patient Normal hemoglobin and iron panel.  Hold off IV Venofer at this point.  Her hemoglobin is at high normal end. Will check erythrocytosis at next visit.   History of gastric bypass Check iron panel, Folate and Vitamin B12 level every 6 months.    Orders Placed This Encounter  Procedures   CBC with Differential/Platelet    Standing Status:   Future    Standing Expiration Date:   12/10/2022   Ferritin    Standing Status:   Future    Standing Expiration Date:   12/10/2022   Iron and TIBC    Standing Status:   Future    Standing Expiration Date:   12/10/2022   JAK2 V617F rfx CALR/MPL/E12-15    Standing Status:   Future    Standing Expiration Date:   12/10/2022   BCR-ABL1 FISH    Standing Status:   Future    Standing Expiration Date:   12/10/2022   Vitamin B12    Standing Status:   Future    Standing Expiration Date:   12/10/2022   Folate    Standing Status:   Future    Standing Expiration Date:   12/10/2022   Follow up in 6 months.  All questions were answered. The patient knows to call the clinic with any problems, questions or concerns.  Rickard Patience, MD, PhD [facility] Hematology Oncology 12/09/2021   CHIEF COMPLAINTS/REASON FOR VISIT:  iron deficiency anemia  HISTORY OF PRESENTING ILLNESS:  Melanie Olson is a  51 y.o.  female presents for follow up for iron deficiency anemia, gastric bypass  03/21/2020-03/24/2020 patient was recently admitted due to abdominal pain.   Patient has been seen by gastroenterology and EGD showed 8 mm ulcer at the gastrojejunal anastomosis.  Patient was recommended to  continue Protonix twice daily as well as Carafate. Patient was discharged and she returned to emergency room on 03/27/2020 due to worsening of abdominal pain. She underwent a repeat CT scan which revealed some increasing stranding and inflammation near the GJ anastomotic line with what appeared to be a possible contained perforation of the ulcer noted on her prior upper endoscopy.  No free fluid or free air was definitively noted.  Patient was treated with empiric IV antibiotics and antifungals.  Underwent upper GI barium swallow which did not show any perforation. Patient was also found to have anemia with iron deficiency. 03/21/2020, hemoglobin 6.7.  Status post PRBC transfusions in the hospital.  Patient was referred to establish care with hematology for treatment of iron deficiency anemia. Patient denies any abdominal pain today.  Since discharge, she has felt improvement of her fatigue.  Denies any black or bloody bowel movement.  Reports that menses is heavy sometimes.  INTERVAL HISTORY Melanie Olson is a 51 y.o. female who has above history reviewed by me today presents for follow up visit for management of Iron deficiency anemia Patient has previously received IV Venofer previously and tolerated well Today she reports feeling well.  She has no new complaints.   Review of Systems  Constitutional:  Negative for appetite change, chills, fatigue and fever.  HENT:   Negative for hearing loss  and voice change.   Eyes:  Negative for eye problems.  Respiratory:  Negative for chest tightness and cough.   Cardiovascular:  Negative for chest pain.  Gastrointestinal:  Negative for abdominal distention, abdominal pain and blood in stool.  Endocrine: Negative for hot flashes.  Genitourinary:  Negative for difficulty urinating and frequency.   Musculoskeletal:  Negative for arthralgias.  Skin:  Negative for itching and rash.  Neurological:  Negative for extremity weakness.  Hematological:   Negative for adenopathy.  Psychiatric/Behavioral:  Negative for confusion.     MEDICAL HISTORY:  Past Medical History:  Diagnosis Date   Abdominal wall ulcer (HCC)    Depression    GERD (gastroesophageal reflux disease)    HSV (herpes simplex virus) infection    IDA (iron deficiency anemia) 04/08/2020   Migraine     SURGICAL HISTORY: Past Surgical History:  Procedure Laterality Date   CHOLECYSTECTOMY     COLONOSCOPY WITH PROPOFOL N/A 07/11/2019   Procedure: COLONOSCOPY WITH PROPOFOL;  Surgeon: Virgel Manifold, MD;  Location: [facility] ENDOSCOPY;  Service: Endoscopy;  Laterality: N/A;   ESOPHAGOGASTRODUODENOSCOPY (EGD) WITH PROPOFOL N/A 07/11/2019   Procedure: ESOPHAGOGASTRODUODENOSCOPY (EGD) WITH PROPOFOL;  Surgeon: Virgel Manifold, MD;  Location: [facility] ENDOSCOPY;  Service: Endoscopy;  Laterality: N/A;   ESOPHAGOGASTRODUODENOSCOPY (EGD) WITH PROPOFOL N/A 03/23/2020   Procedure: ESOPHAGOGASTRODUODENOSCOPY (EGD) WITH PROPOFOL;  Surgeon: Virgel Manifold, MD;  Location: [facility] ENDOSCOPY;  Service: Endoscopy;  Laterality: N/A;   GASTRIC BYPASS  2006   tummy tuck      SOCIAL HISTORY: Social History   Socioeconomic History   Marital status: Significant Other    Spouse name: Not on file   Number of children: 2   Years of education: Not on file   Highest education level: Not on file  Occupational History   Not on file  Tobacco Use   Smoking status: Former    Years: 5.00    Types: Cigarettes    Quit date: 04/08/2008    Years since quitting: 13.6   Smokeless tobacco: Never   Tobacco comments:    social smoking on and off for 5 years   Vaping Use   Vaping Use: Never used  Substance and Sexual Activity   Alcohol use: No   Drug use: No   Sexual activity: Not on file  Other Topics Concern   Not on file  Social History Narrative   Not on file   Social Determinants of Health   Financial Resource Strain: Not on file  Food Insecurity: Not on file  Transportation Needs: Not  on file  Physical Activity: Not on file  Stress: Not on file  Social Connections: Not on file  Intimate Partner Violence: Not on file    FAMILY HISTORY: Family History  Problem Relation Age of Onset   High Cholesterol Mother    Stroke Father    Aneurysm Father    Diabetes Maternal Grandmother    Heart disease Paternal Grandmother     ALLERGIES:  is allergic to penicillins.  MEDICATIONS:  Current Outpatient Medications  Medication Sig Dispense Refill   acetaminophen (TYLENOL) 325 MG tablet Take 2 tablets (650 mg total) by mouth every 6 (six) hours as needed for mild pain (or Fever >/= 101).     acyclovir (ZOVIRAX) 200 MG capsule Take 2 capsules by mouth in the morning and at bedtime.     amitriptyline (ELAVIL) 25 MG tablet Take 25 mg by mouth at bedtime.     cyclobenzaprine (FLEXERIL)  10 MG tablet Take 10 mg by mouth at bedtime as needed for muscle spasms.     Multiple Vitamin (MULTIVITAMIN) tablet Take 1 tablet by mouth daily. bariatric vitamins     pantoprazole (PROTONIX) 40 MG tablet Take 1 tablet (40 mg total) by mouth 2 (two) times daily for 180 doses. 90 tablet 0   sucralfate (CARAFATE) 1 GM/10ML suspension Take 10 mLs (1 g total) by mouth 4 (four) times daily -  with meals and at bedtime. 420 mL 2   No current facility-administered medications for this visit.     PHYSICAL EXAMINATION: ECOG PERFORMANCE STATUS: 1 - Symptomatic but completely ambulatory Vitals:   12/09/21 1450  BP: 123/78  Pulse: 78  Resp: 18  Temp: 98.9 F (37.2 C)   Filed Weights   12/09/21 1450  Weight: 183 lb 12.8 oz (83.4 kg)    Physical Exam Constitutional:      General: She is not in acute distress. HENT:     Head: Normocephalic and atraumatic.  Eyes:     General: No scleral icterus. Cardiovascular:     Rate and Rhythm: Normal rate and regular rhythm.     Heart sounds: Normal heart sounds.  Pulmonary:     Effort: Pulmonary effort is normal. No respiratory distress.     Breath  sounds: No wheezing.  Abdominal:     General: Bowel sounds are normal. There is no distension.     Palpations: Abdomen is soft.  Musculoskeletal:        General: No deformity. Normal range of motion.     Cervical back: Normal range of motion and neck supple.  Skin:    General: Skin is warm and dry.     Findings: No erythema or rash.  Neurological:     Mental Status: She is alert and oriented to person, place, and time. Mental status is at baseline.     Cranial Nerves: No cranial nerve deficit.     Coordination: Coordination normal.  Psychiatric:        Mood and Affect: Mood normal.     LABORATORY DATA:  I have reviewed the data as listed    Latest Ref Rng & Units 12/08/2021    3:45 PM 06/02/2021    3:49 PM 12/01/2020    1:22 PM  CBC  WBC 4.0 - 10.5 K/uL 3.9  4.5  4.7   Hemoglobin 12.0 - 15.0 g/dL 93.7  16.9  67.8   Hematocrit 36.0 - 46.0 % 46.1  45.6  41.0   Platelets 150 - 400 K/uL 277  251  264       Latest Ref Rng & Units 12/08/2021    3:45 PM 06/02/2021    3:49 PM 12/01/2020    1:22 PM  CMP  Glucose 70 - 99 mg/dL 938  101  95   BUN 6 - 20 mg/dL 19  17  20    Creatinine 0.44 - 1.00 mg/dL  7.51  0.25   Sodium 135 - 145 mmol/L 138  135  136   Potassium 3.5 - 5.1 mmol/L 4.1  3.9  3.9   Chloride 98 - 111 mmol/L 104  100  103   CO2 22 - 32 mmol/L 26  28  27    Calcium 8.9 - 10.3 mg/dL 9.1  9.0  8.7   Total Protein 6.5 - 8.1 g/dL 7.7  7.3  6.9   Total Bilirubin 0.3 - 1.2 mg/dL 0.5  0.5  0.8   Alkaline Phos  38 - 126 U/L 79  77  67   AST 15 - 41 U/L 25  18  16    ALT 0 - 44 U/L 30  18  18       Iron/TIBC/Ferritin/ %Sat    Component Value Date/Time   IRON 58 12/08/2021 1545   TIBC 367 12/08/2021 1545   FERRITIN 58 12/08/2021 1545   IRONPCTSAT 16 12/08/2021 1545     RADIOGRAPHIC STUDIES: I have personally reviewed the radiological images as listed and agreed with the findings in the report. No results found.

## 2021-12-09 NOTE — Assessment & Plan Note (Addendum)
#  Iron deficiency anemia in the context of gastric bypass Labs reviewed and discussed with patient Normal hemoglobin and iron panel.  Hold off IV Venofer at this point.  Her hemoglobin is at high normal end. Will check erythrocytosis at next visit.

## 2021-12-09 NOTE — Assessment & Plan Note (Signed)
Check iron panel, Folate and Vitamin B12 level every 6 months.

## 2022-05-01 IMAGING — US US ABDOMEN LIMITED RUQ/ASCITES
1 series · 14 of 25 positions shown · non-contrast
Comparison: CT abdomen and pelvis March 21, 2020

CLINICAL DATA: Hepatic cirrhosis.

EXAM:
ULTRASOUND ABDOMEN LIMITED RIGHT UPPER QUADRANT

[Series 1: us abdomen limited ruq (liver/gb) · 14 of 37 slices shown]
[im 1/37]
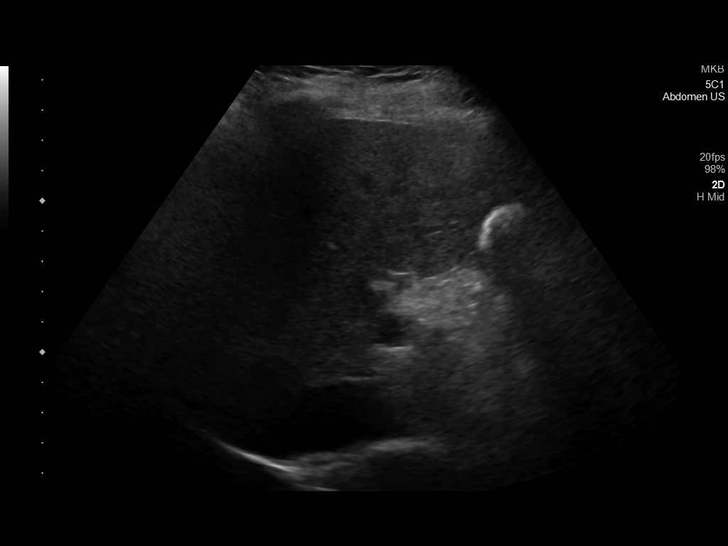
[im 4/37]
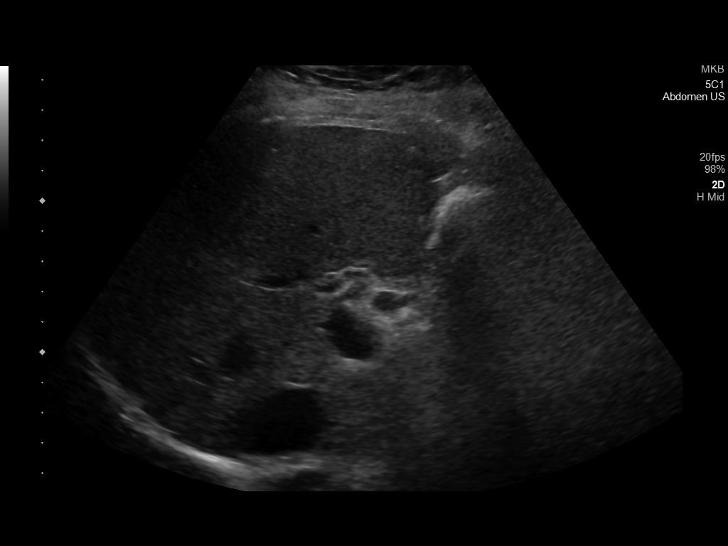
[im 7/37]
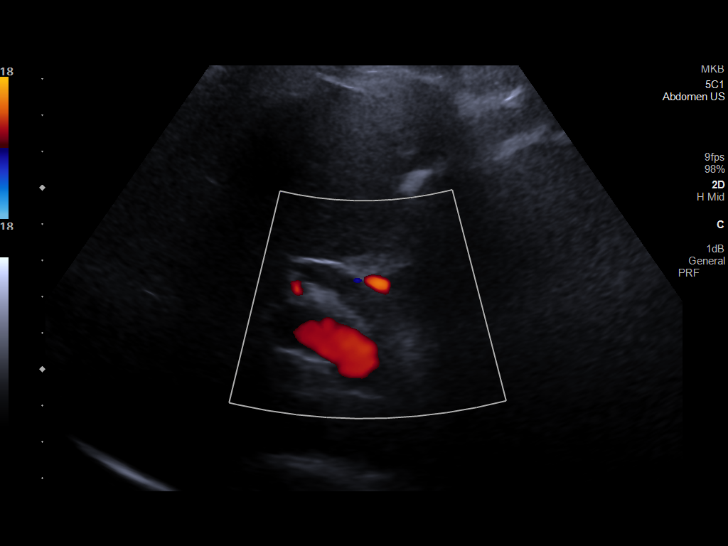
[im 10/37]
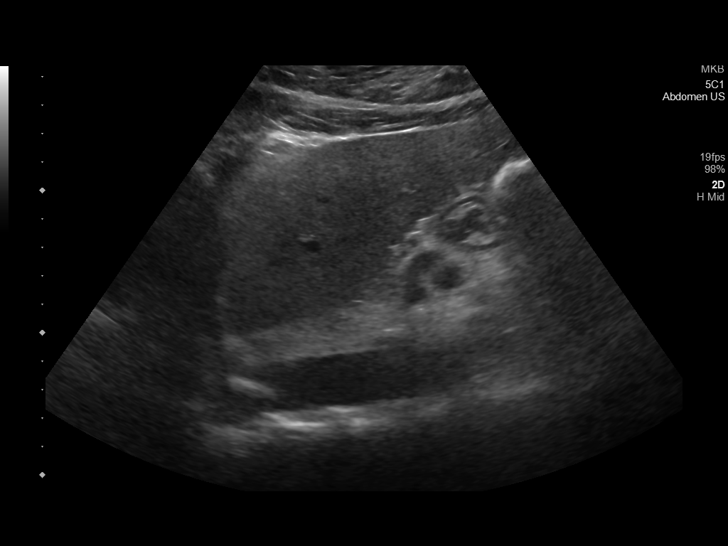
[im 13/37]
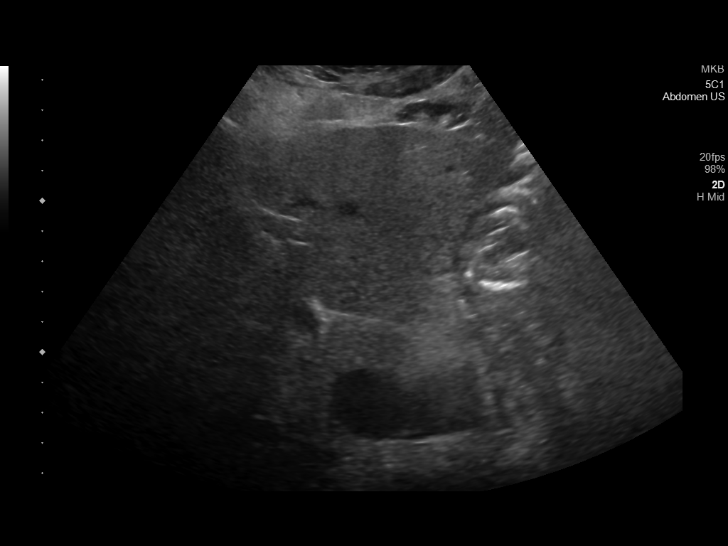
[im 14/37]
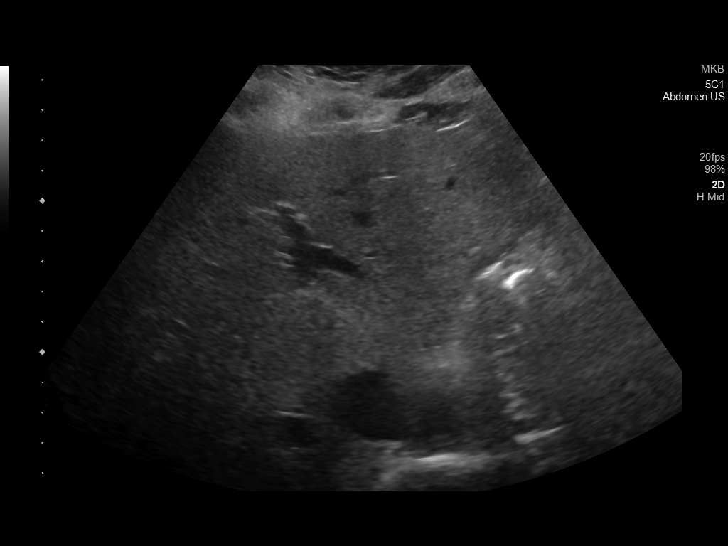
[im 17/37]
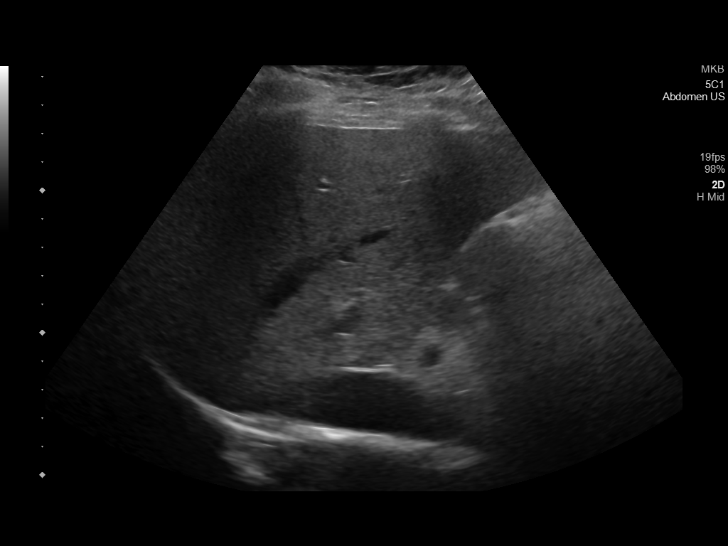
[im 20/37]
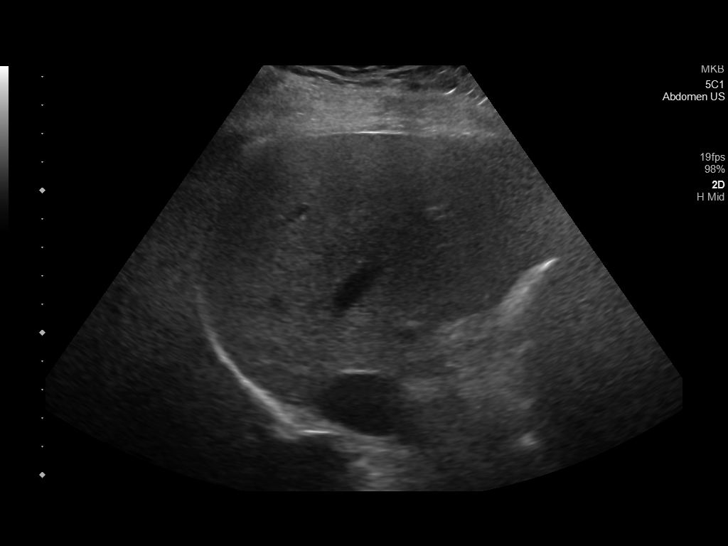
[im 23/37]
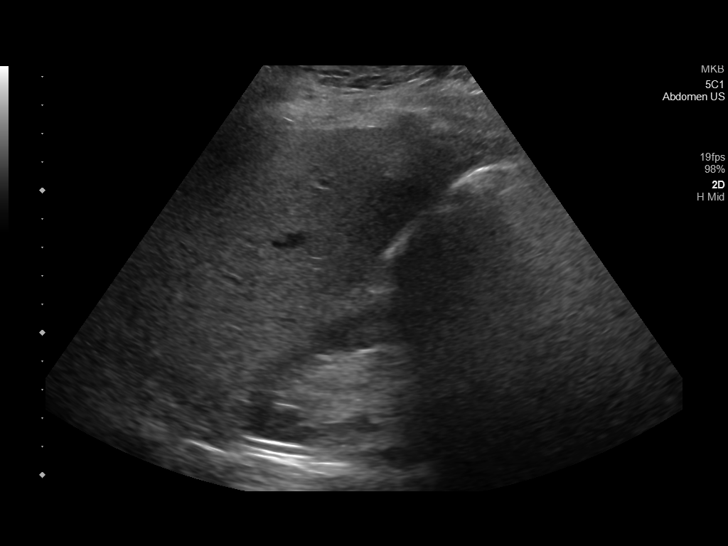
[im 25/37]
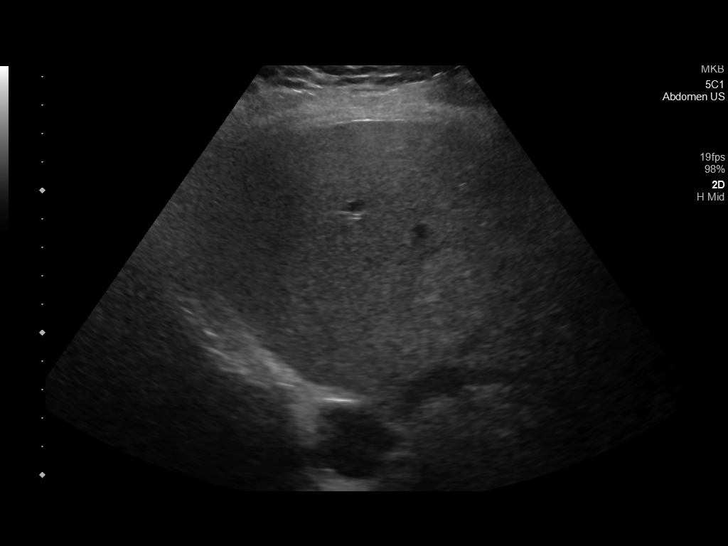
[im 28/37]
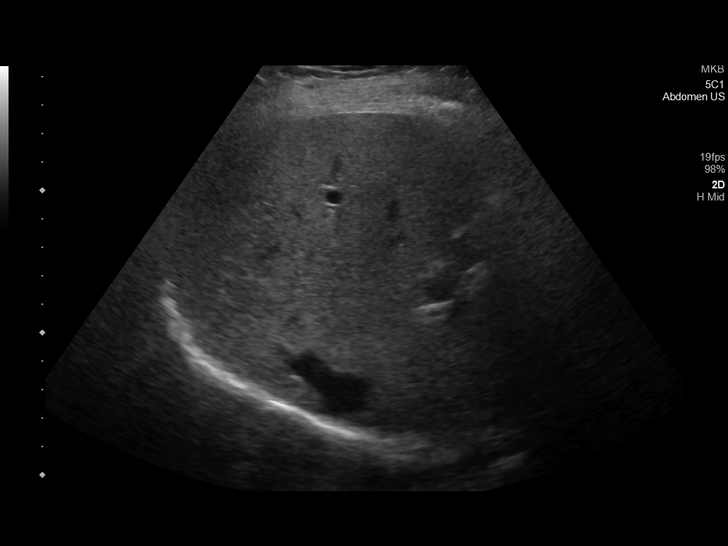
[im 31/37]
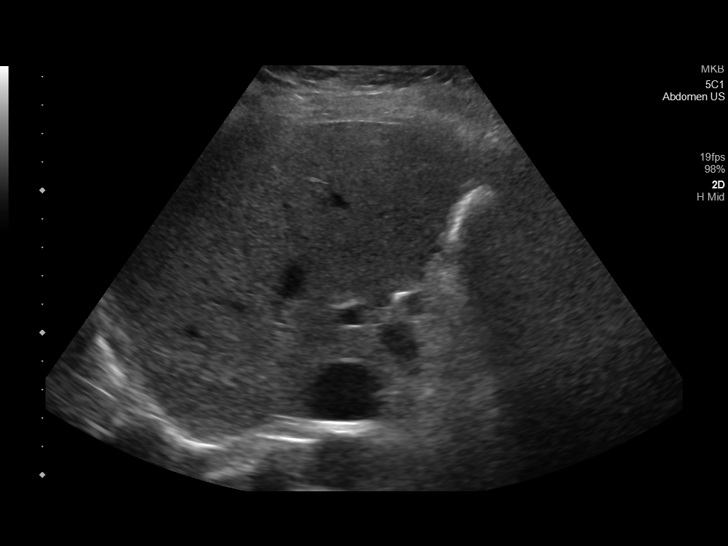
[im 34/37]
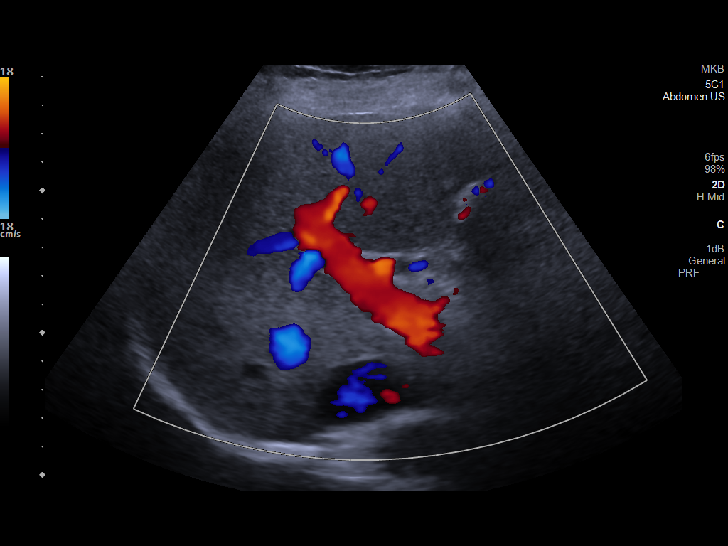
[im 37/37]
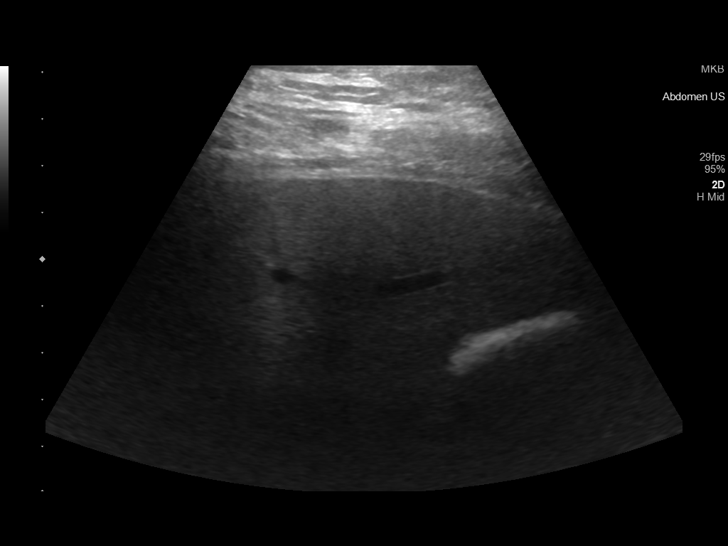

[14 of 25 positions shown; findings below may reference images not displayed]

FINDINGS: Gallbladder:

Surgically absent

Common bile duct:

Diameter: 7 mm

Liver:

No focal lesion identified. Diffusely increased parenchymal
echogenicity. Portal vein is patent on color Doppler imaging with
normal direction of blood flow towards the liver.

Other: None.
IMPRESSION: 1. The echogenicity of the liver is increased. This is a nonspecific
finding but is most commonly seen with fatty infiltration of the
liver. There are no obvious focal liver lesions.
2. Prior cholecystectomy.

## 2022-05-08 IMAGING — RF DG UGI W SINGLE CM
9 of 14 series · 13 of 23 positions shown · non-contrast
Comparison: CT abdomen/pelvis 03/27/2020.

CLINICAL DATA: Marginal ulcer.

EXAM:
UPPER GI SERIES WITH KUB
TECHNIQUE: After obtaining a scout radiograph a routine upper GI series was
performed using thin and high density barium.
FLUOROSCOPY TIME:  Fluoroscopy Time:  1 minutes, 30 seconds.
Radiation Exposure Index (if provided by the fluoroscopic device):
80 mGy.
Number of Acquired Spot Images: 8

[Series 1: t abdomen supine · 0.15mm/px · 1 of 1 slices shown]
[im 1/1]
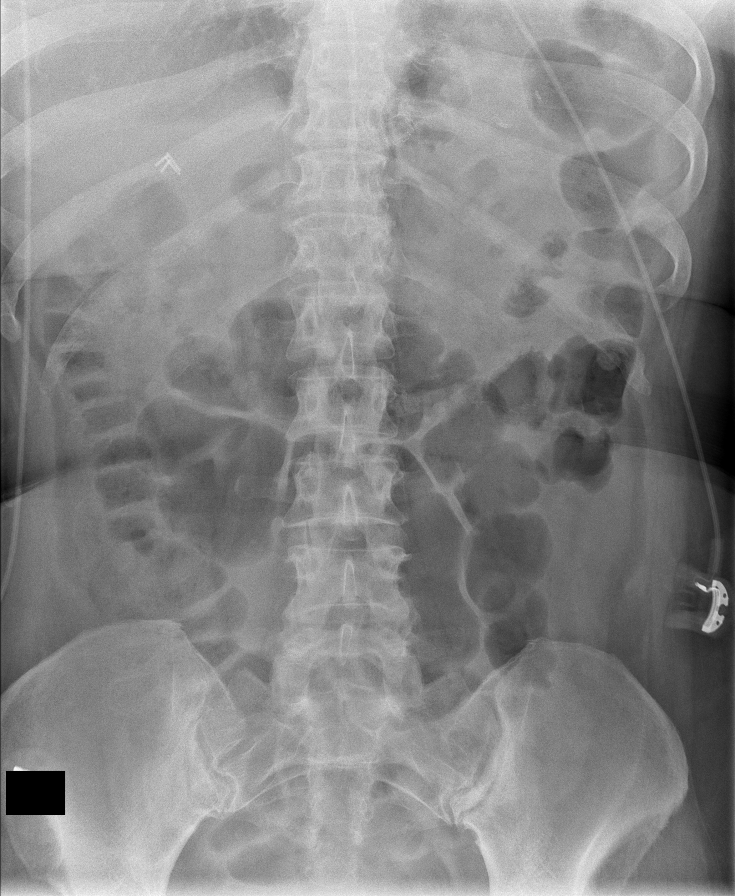

[Series 2: cp_standard · 0.34mm/px · 2 of 64 frames shown (1 of 4)]
[frame 10/64]
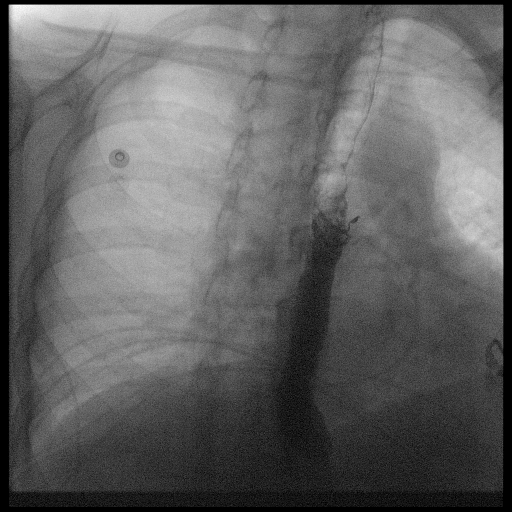
[frame 55/64]
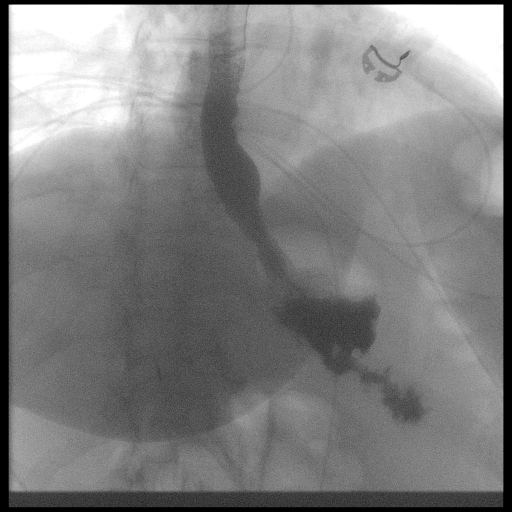

[Series 4: fluoro_barium singleshot_bw · 0.17mm/px · 1 of 1 slices shown (1 of 4)]
[im 1/1]
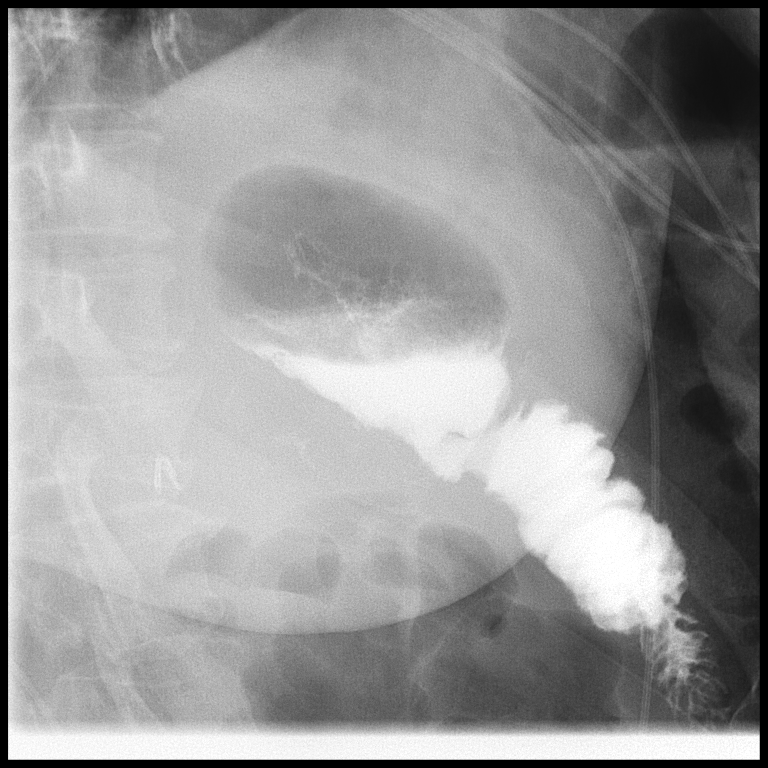

[Series 5: fluoro_barium singleshot_bw · 0.17mm/px · 1 of 1 slices shown (2 of 4)]
[im 1/1]
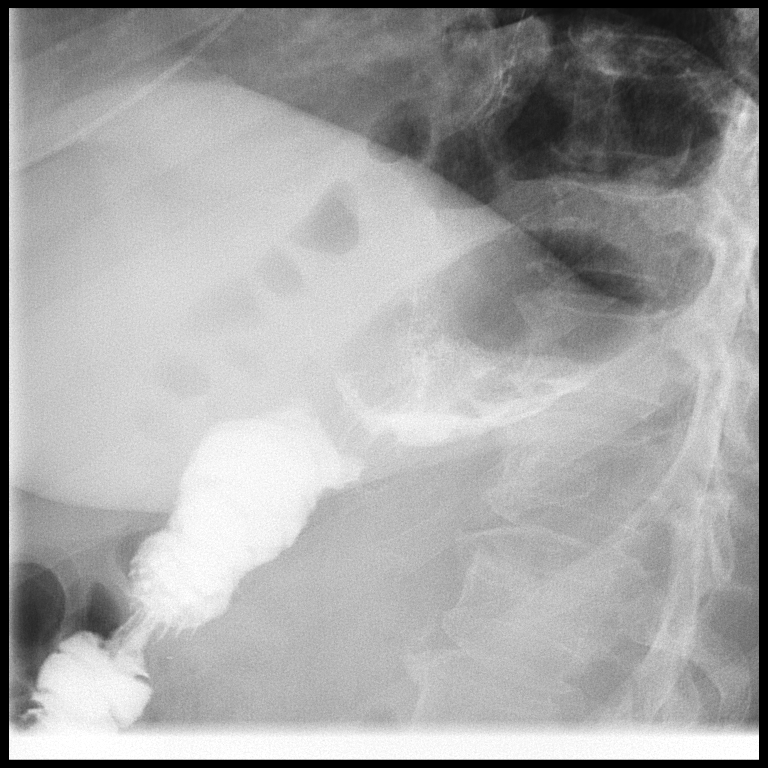

[Series 7: cp_standard · 0.34mm/px · 2 of 78 frames shown (2 of 4)]
[frame 12/78]
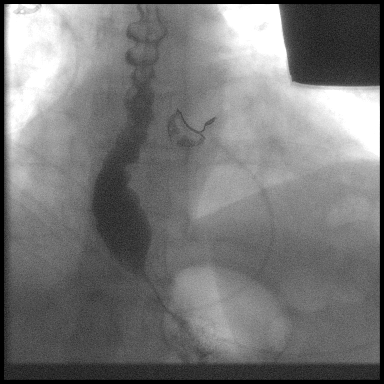
[frame 40/78]
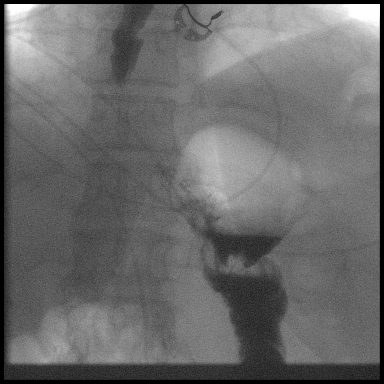

[Series 8: cp_standard · 0.34mm/px · 3 of 8 frames shown (3 of 4)]
[frame 2/8]
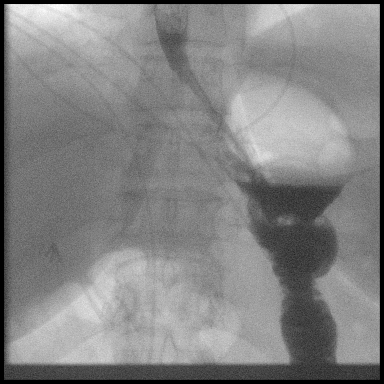
[frame 7/8]
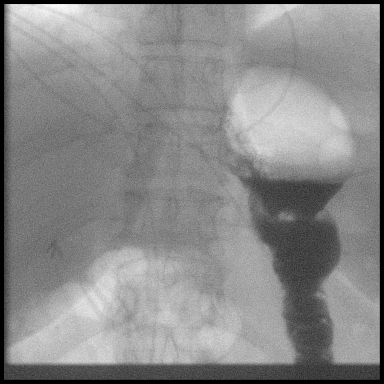
[frame 8/8]
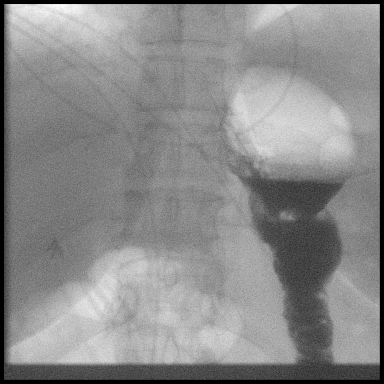

[Series 10: fluoro_barium singleshot_bw · 0.08mm/px · 1 of 1 slices shown (3 of 4)]
[im 1/1]
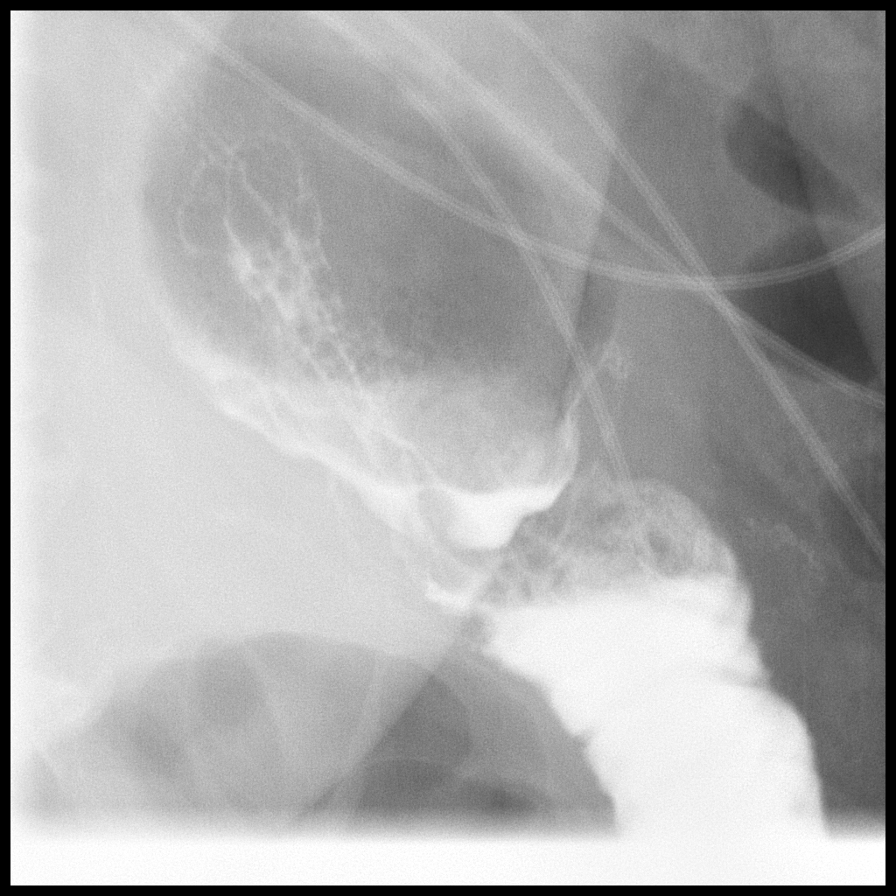

[Series 12: cp_standard · 0.08mm/px · 1 of 1 slices shown (4 of 4)]
[im 1/1]
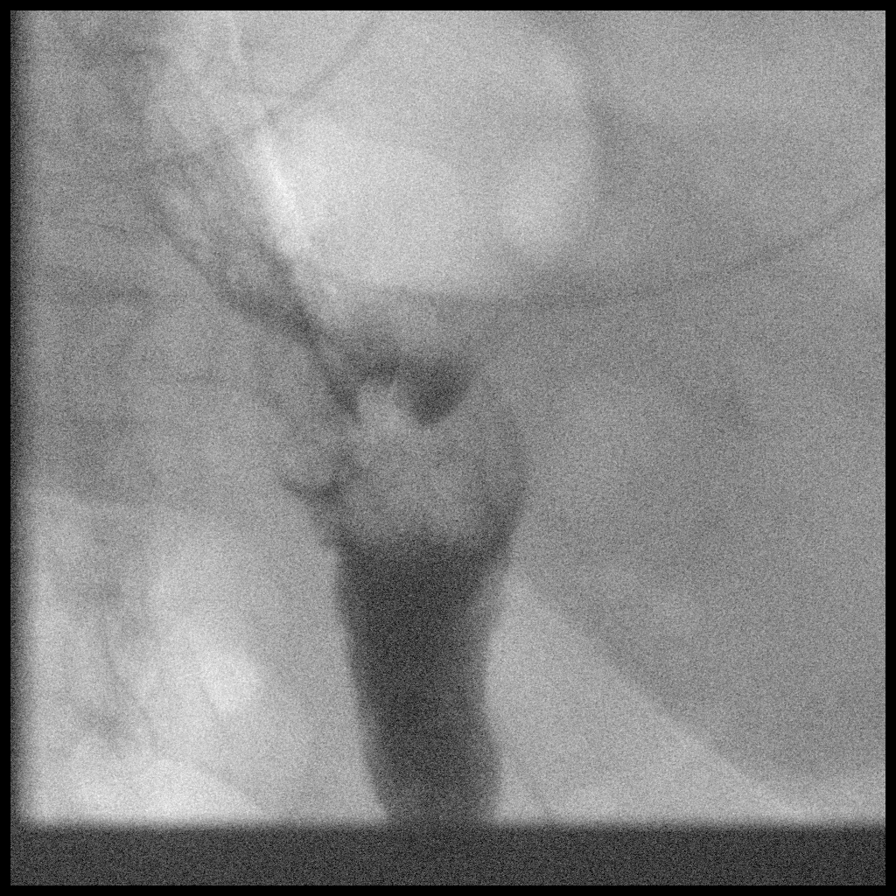

[Series 14: fluoro_barium singleshot_bw · 0.17mm/px · 1 of 1 slices shown (4 of 4)]
[im 1/1]
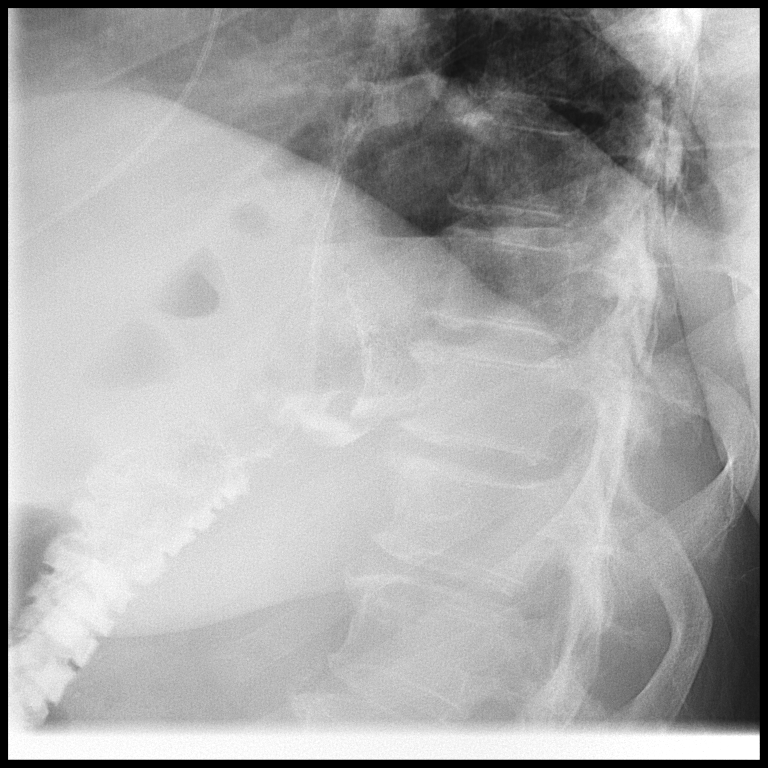

[13 of 23 positions shown; findings below may reference images not displayed]

FINDINGS: A scout radiograph of the abdomen demonstrates a nonobstructive
bowel gas pattern. Cholecystectomy clips within the right upper
quadrant of the abdomen.

A problem-oriented water-soluble contrast upper GI series was
performed to assess for free perforation at site of a known marginal
ulcer. Expected post Roux-en-Y anatomy. No intraperitoneal concha is
demonstrated to free perforation. A known marginal ulcer with
contained perforation was better delineated on the recent prior CT
abdomen/pelvis of 03/27/2020.

There is brisk passage of contrast from the distal esophagus into
the gastric remnant and subsequently into the Roux limb. Moderate to
severe intermittent esophageal dysmotility.
IMPRESSION: Problem-oriented water-soluble contrast upper GI series performed to
assess for free perforation at site of a known marginal ulcer. No
intraperitoneal contrast is demonstrated to suggest free
perforation.

The known marginal ulcer with contained perforation was better
delineated on the recent prior CT abdomen/pelvis of 03/27/2020.

Moderate severe intermittent esophageal dysmotility.

## 2022-05-27 ENCOUNTER — Emergency Department: Payer: BC Managed Care – PPO

## 2022-05-27 ENCOUNTER — Other Ambulatory Visit: Payer: Self-pay

## 2022-05-27 ENCOUNTER — Emergency Department
Admission: EM | Admit: 2022-05-27 | Discharge: 2022-05-28 | Disposition: A | Discharge status: Home / Self Care | Payer: BC Managed Care – PPO | Attending: Emergency Medicine | Admitting: Emergency Medicine

## 2022-05-27 DIAGNOSIS — W010XXA Fall on same level from slipping, tripping and stumbling without subsequent striking against object, initial encounter: Secondary | ICD-10-CM | POA: Diagnosis not present

## 2022-05-27 DIAGNOSIS — Y93A6 Activity, grass drills: Secondary | ICD-10-CM | POA: Insufficient documentation

## 2022-05-27 DIAGNOSIS — S2231XA Fracture of one rib, right side, initial encounter for closed fracture: Secondary | ICD-10-CM | POA: Diagnosis not present

## 2022-05-27 DIAGNOSIS — S299XXA Unspecified injury of thorax, initial encounter: Secondary | ICD-10-CM | POA: Diagnosis present

## 2022-05-27 DIAGNOSIS — W19XXXA Unspecified fall, initial encounter: Secondary | ICD-10-CM

## 2022-05-27 LAB — CBC WITH DIFFERENTIAL/PLATELET
Abs Immature Granulocytes: 0.02 10*3/uL (ref 0.00–0.07)
Basophils Absolute: 0 10*3/uL (ref 0.0–0.1)
Basophils Relative: 0 %
Eosinophils Absolute: 0.1 10*3/uL (ref 0.0–0.5)
Eosinophils Relative: 1 %
HCT: 47.5 % — ABNORMAL HIGH (ref 36.0–46.0)
Hemoglobin: 15.5 g/dL — ABNORMAL HIGH (ref 12.0–15.0)
Immature Granulocytes: 0 %
Lymphocytes Relative: 18 %
Lymphs Abs: 1.2 10*3/uL (ref 0.7–4.0)
MCH: 30.4 pg (ref 26.0–34.0)
MCHC: 32.6 g/dL (ref 30.0–36.0)
MCV: 93.1 fL (ref 80.0–100.0)
Monocytes Absolute: 0.3 10*3/uL (ref 0.1–1.0)
Monocytes Relative: 5 %
Neutro Abs: 5.2 10*3/uL (ref 1.7–7.7)
Neutrophils Relative %: 76 %
Platelets: 291 10*3/uL (ref 150–400)
RBC: 5.1 MIL/uL (ref 3.87–5.11)
RDW: 11.8 % (ref 11.5–15.5)
WBC: 6.8 10*3/uL (ref 4.0–10.5)
nRBC: 0 % (ref 0.0–0.2)

## 2022-05-27 LAB — COMPREHENSIVE METABOLIC PANEL
ALT: 23 U/L (ref 0–44)
AST: 23 U/L (ref 15–41)
Albumin: 4.4 g/dL (ref 3.5–5.0)
Alkaline Phosphatase: 90 U/L (ref 38–126)
Anion gap: 7 (ref 5–15)
BUN: 17 mg/dL (ref 6–20)
CO2: 28 mmol/L (ref 22–32)
Calcium: 9.2 mg/dL (ref 8.9–10.3)
Chloride: 103 mmol/L (ref 98–111)
Creatinine, Ser: 0.6 mg/dL (ref 0.44–1.00)
GFR, Estimated: >60 mL/min (ref >60)
Glucose, Bld: 121 mg/dL — ABNORMAL HIGH (ref 70–99)
Potassium: 3.9 mmol/L (ref 3.5–5.1)
Sodium: 138 mmol/L (ref 135–145)
Total Bilirubin: 1 mg/dL (ref 0.3–1.2)
Total Protein: 7.6 g/dL (ref 6.5–8.1)

## 2022-05-27 MED ORDER — HYDROMORPHONE HCL 1 MG/ML IJ SOLN
1.0000 mg | Freq: Once | INTRAMUSCULAR | Status: AC
  Administered 2022-05-27: 1 mg via INTRAVENOUS
  Filled 2022-05-27: qty 1

## 2022-05-27 MED ORDER — IOHEXOL 300 MG/ML  SOLN
75.0000 mL | Freq: Once | INTRAMUSCULAR | Status: AC | PRN
  Administered 2022-05-27: 75 mL via INTRAVENOUS

## 2022-05-27 MED ORDER — MORPHINE SULFATE (PF) 4 MG/ML IV SOLN
4.0000 mg | Freq: Once | INTRAVENOUS | Status: AC
  Administered 2022-05-27: 4 mg via SUBCUTANEOUS
  Filled 2022-05-27: qty 1

## 2022-05-27 MED ORDER — PREDNISONE 50 MG PO TABS: 50.0000 mg | ORAL_TABLET | Freq: Every day | ORAL | 0 refills | Status: AC

## 2022-05-27 MED ORDER — OXYCODONE-ACETAMINOPHEN 5-325 MG PO TABS: 1.0000 | ORAL_TABLET | Freq: Four times a day (QID) | ORAL | 0 refills | Status: DC | PRN

## 2022-05-27 MED ORDER — METHOCARBAMOL 500 MG PO TABS: 500.0000 mg | ORAL_TABLET | Freq: Four times a day (QID) | ORAL | 0 refills | Status: DC

## 2022-05-27 MED ORDER — KETOROLAC TROMETHAMINE 30 MG/ML IJ SOLN
30.0000 mg | Freq: Once | INTRAMUSCULAR | Status: AC
  Administered 2022-05-27: 30 mg via INTRAVENOUS
  Filled 2022-05-27: qty 1

## 2022-05-27 MED ORDER — METHOCARBAMOL 500 MG PO TABS
1000.0000 mg | ORAL_TABLET | Freq: Once | ORAL | Status: AC
  Administered 2022-05-27: 1000 mg via ORAL
  Filled 2022-05-27: qty 2

## 2022-05-27 MED ORDER — SODIUM CHLORIDE 0.9 % IV BOLUS
500.0000 mL | Freq: Once | INTRAVENOUS | Status: AC
  Administered 2022-05-27: 500 mL via INTRAVENOUS

## 2022-05-27 MED ORDER — ONDANSETRON 8 MG PO TBDP
8.0000 mg | ORAL_TABLET | Freq: Once | ORAL | Status: AC
  Administered 2022-05-27: 8 mg via ORAL
  Filled 2022-05-27: qty 1

## 2022-05-27 NOTE — ED Provider Notes (Signed)
[facility] Provider Note  Patient Contact: 7:15 PM (approximate)   History   Fall   HPI  Melanie Olson is a 52 y.o. female who presents the emergency department complaining of diffuse right-sided rib pain.  Patient was mowing the grass, slipped and landed on her right chest wall.  Patient is having significant pain along the right chest wall diffusely.  Patient states that the pain makes her feel short of breath though she is not short of breath.  No substernal pain.  Did not hit her head or lose consciousness.  She did hit her elbow and her ankle but does not want imaging of either at this time as she states that both of them have full range of motion and she is not concerned for injury there.     Physical Exam   Triage Vital Signs: ED Triage Vitals [05/27/22 1748]  Enc Vitals Group     BP (!) 132/111     Pulse Rate 96     Resp 18     Temp 98 F (36.7 C)     Temp Source Oral     SpO2 99 %     Weight      Height      Head Circumference      Peak Flow      Pain Score 9     Pain Loc      Pain Edu?      Excl. in GC?     Most recent vital signs: Vitals:   05/27/22 2115 05/27/22 2130  BP: 131/86 116/70  Pulse: 82 79  Resp:  16  Temp:    SpO2: 96% 93%     General: Alert and in no acute distress. Head: No acute traumatic findings  Neck: No stridor. No cervical spine tenderness to palpation.  Cardiovascular:  Good peripheral perfusion Respiratory: Normal respiratory effort without tachypnea or retractions. Lungs CTAB. Good air entry to the bases with no decreased or absent breath sounds. Musculoskeletal: Full range of motion to all extremities.  Visualization of the right chest wall reveals no obvious deformity.  No paradoxical chest wall movement.  Equal chest rise and fall.  Palpation reveals diffuse tenderness without palpable abnormality, crepitus or subcutaneous emphysema.  Good underlying breath sounds bilaterally. Neurologic:  No  gross focal neurologic deficits are appreciated.  Skin:   No rash noted Other:   ED Results / Procedures / Treatments   Labs (all labs ordered are listed, but only abnormal results are displayed) Labs Reviewed  COMPREHENSIVE METABOLIC PANEL - Abnormal; Notable for the following components:      Result Value   Glucose, Bld 121 (*)    All other components within normal limits  CBC WITH DIFFERENTIAL/PLATELET - Abnormal; Notable for the following components:   Hemoglobin 15.5 (*)    HCT 47.5 (*)    All other components within normal limits     EKG  ED ECG REPORT I, Delorise Royals Kao Berkheimer,  personally viewed and interpreted this ECG.   Date: 05/27/2022  EKG Time: 1749 hrs.  Rate: 84 bpm  Rhythm: unchanged from previous tracings, normal sinus rhythm  Axis: Normal axis  Intervals:none  ST&T Change: No gross ST elevation or depression noted    RADIOLOGY  I personally viewed, evaluated, and interpreted these images as part of my medical decision making, as well as reviewing the written report by the radiologist.  ED Provider Interpretation: No acute cardiopulmonary finding on chest x-ray.  CT of the chest reveals nondisplaced seventh rib fracture right side.  No other traumatic findings.  CT Chest W Contrast  Result Date: 05/27/2022 CLINICAL DATA:  Blunt chest trauma, right chest pain EXAM: CT CHEST WITH CONTRAST TECHNIQUE: Multidetector CT imaging of the chest was performed during intravenous contrast administration. RADIATION DOSE REDUCTION: This exam was performed according to the departmental dose-optimization program which includes automated exposure control, adjustment of the mA and/or kV according to patient size and/or use of iterative reconstruction technique. CONTRAST:  75mL OMNIPAQUE IOHEXOL 300 MG/ML  SOLN COMPARISON:  None Available. FINDINGS: Cardiovascular: Moderate left anterior descending coronary artery calcification. Global cardiac size within normal limits. No  pericardial effusion. 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 Central pulmonary arteries are of normal caliber. Thoracic aorta is unremarkable. Mediastinum/Nodes: 16 mm left thyroid nodule, not well characterized on this exam. No pathologic thoracic adenopathy. Esophagus unremarkable. Lungs/Pleura: Bibasilar atelectasis. Lungs are otherwise clear. No pneumothorax or pleural effusion. Upper Abdomen: No acute abnormality. Surgical changes of cholecystectomy gastric bypass noted. Musculoskeletal: Acute minimally displaced fracture of the right seventh rib laterally. Osseous structures are otherwise age-appropriate. IMPRESSION: 1. Acute minimally displaced fracture of the right seventh rib laterally. No pneumothorax. 2. Moderate left anterior descending coronary artery calcification. 3. 16 mm left thyroid nodule, not well characterized on this exam. Recommend thyroid US (ref: J Am Coll Radiol. 2015 Feb;12(2): 143-50). Electronically Signed   By: Helyn Numbers M.D.   On: 05/27/2022 22:19   DG Chest 2 View  Result Date: 05/27/2022 CLINICAL DATA:  Pain after fall EXAM: CHEST - 2 VIEW COMPARISON:  X-ray 12/22/2010 FINDINGS: No consolidation, pneumothorax or effusion. No edema. Normal cardiopericardial silhouette. Degenerative changes of the spine lateral view. Pectus carinatum IMPRESSION: No acute cardiopulmonary disease Electronically Signed   By: Karen Kays M.D.   On: 05/27/2022 18:21    PROCEDURES:  Critical Care performed: No  Procedures   MEDICATIONS ORDERED IN ED: Medications  HYDROmorphone (DILAUDID) injection 1 mg (has no administration in time range)  ketorolac (TORADOL) 30 MG/ML injection 30 mg (has no administration in time range)  morphine (PF) 4 MG/ML injection 4 mg (4 mg Subcutaneous Given 05/27/22 2003)  ondansetron (ZOFRAN-ODT) disintegrating tablet 8 mg (8 mg Oral Given 05/27/22 2002)   methocarbamol (ROBAXIN) tablet 1,000 mg (1,000 mg Oral Given 05/27/22 2001)  HYDROmorphone (DILAUDID) injection 1 mg (1 mg Intravenous Given 05/27/22 2109)  sodium chloride 0.9 % bolus 500 mL (500 mLs Intravenous New Bag/Given 05/27/22 2115)  iohexol (OMNIPAQUE) 300 MG/ML solution 75 mL (75 mLs Intravenous Contrast Given 05/27/22 2150)     IMPRESSION / MDM / ASSESSMENT AND PLAN / ED COURSE  I reviewed the triage vital signs and the nursing notes.                                 Differential diagnosis includes, but is not limited to, rib contusion, rib fracture, pneumothorax  Patient's presentation is most consistent with acute presentation with potential threat to life or bodily function.   Patient's diagnosis is consistent with rib fracture.  Patient presents emergency department after mechanical fall.  She fell on her right ribs.  Initial chest x-ray did not reveal any traumatic finding.  Patient did have a CT scan given the amount of pain which revealed seventh rib fracture but no other underlying traumatic  finding.  Patient will have steroid she has a history of gastric ulcers, pain medication and muscle relaxer prescribed for her for pain relief.  Concerning signs and symptoms and follow-up precautions discussed with the patient.  Follow-up primary care as needed. Patient is given ED precautions to return to the ED for any worsening or new symptoms.     FINAL CLINICAL IMPRESSION(S) / ED DIAGNOSES   Final diagnoses:  Fall, initial encounter  Closed fracture of one rib of right side, initial encounter     Rx / DC Orders   ED Discharge Orders          Ordered    oxyCODONE-acetaminophen (PERCOCET) 5-325 MG tablet  Every 6 hours PRN        05/27/22 2250    predniSONE (DELTASONE) 50 MG tablet  Daily with breakfast        05/27/22 2253    methocarbamol (ROBAXIN) 500 MG tablet  4 times daily        05/27/22 2253             Note:  This document was prepared using Dragon  voice recognition software and may include unintentional dictation errors.   Lanette Hampshire 05/27/22 2259    Chesley Noon, MD 05/28/22 1116

## 2022-05-27 NOTE — ED Triage Notes (Signed)
Pt to ED via POV from home. Pt reports she was mowing the lawn and fell onto gravel on her left side. Pt reports right rib pain and right sided CP. Pt reports tucked her right elbow into her chest to avoid falling on wrist. Pt denies blood thinners.

## 2022-06-03 ENCOUNTER — Encounter: Payer: Self-pay | Admitting: Oncology

## 2022-06-03 ENCOUNTER — Inpatient Hospital Stay: Payer: Self-pay

## 2022-06-03 ENCOUNTER — Inpatient Hospital Stay: Payer: BC Managed Care – PPO | Attending: Oncology

## 2022-06-03 DIAGNOSIS — Z9884 Bariatric surgery status: Secondary | ICD-10-CM | POA: Insufficient documentation

## 2022-06-03 DIAGNOSIS — D508 Other iron deficiency anemias: Secondary | ICD-10-CM

## 2022-06-03 DIAGNOSIS — D509 Iron deficiency anemia, unspecified: Secondary | ICD-10-CM | POA: Diagnosis not present

## 2022-06-03 LAB — IRON AND TIBC
Iron: 92 ug/dL (ref 28–170)
Saturation Ratios: 27 % (ref 10.4–31.8)
TIBC: 336 ug/dL (ref 250–450)
UIBC: 244 ug/dL

## 2022-06-03 LAB — CBC WITH DIFFERENTIAL/PLATELET
Abs Immature Granulocytes: 0.02 10*3/uL (ref 0.00–0.07)
Basophils Absolute: 0 10*3/uL (ref 0.0–0.1)
Basophils Relative: 0 %
Eosinophils Absolute: 0.1 10*3/uL (ref 0.0–0.5)
Eosinophils Relative: 3 %
HCT: 44.7 % (ref 36.0–46.0)
Hemoglobin: 14.7 g/dL (ref 12.0–15.0)
Immature Granulocytes: 0 %
Lymphocytes Relative: 20 %
Lymphs Abs: 1 10*3/uL (ref 0.7–4.0)
MCH: 30.5 pg (ref 26.0–34.0)
MCHC: 32.9 g/dL (ref 30.0–36.0)
MCV: 92.7 fL (ref 80.0–100.0)
Monocytes Absolute: 0.3 10*3/uL (ref 0.1–1.0)
Monocytes Relative: 7 %
Neutro Abs: 3.6 10*3/uL (ref 1.7–7.7)
Neutrophils Relative %: 70 %
Platelets: 265 10*3/uL (ref 150–400)
RBC: 4.82 MIL/uL (ref 3.87–5.11)
RDW: 11.9 % (ref 11.5–15.5)
WBC: 5.1 10*3/uL (ref 4.0–10.5)
nRBC: 0 % (ref 0.0–0.2)

## 2022-06-03 LAB — FERRITIN: Ferritin: 61 ng/mL (ref 11–307)

## 2022-06-03 LAB — FOLATE: Folate: 25 ng/mL (ref >5.9)

## 2022-06-03 LAB — VITAMIN B12: Vitamin B-12: 567 pg/mL (ref 180–914)

## 2022-06-08 LAB — BCR-ABL1 FISH
Cells Analyzed: 200
Cells Counted: 200

## 2022-06-09 ENCOUNTER — Encounter: Payer: Self-pay | Admitting: Oncology

## 2022-06-09 ENCOUNTER — Inpatient Hospital Stay: Payer: BC Managed Care – PPO | Attending: Oncology | Admitting: Oncology

## 2022-06-09 ENCOUNTER — Other Ambulatory Visit: Payer: Self-pay

## 2022-06-09 ENCOUNTER — Ambulatory Visit: Payer: Self-pay | Admitting: Oncology

## 2022-06-09 VITALS — BP 119/78 | HR 85 | Temp 97.3°F | Wt 183.3 lb

## 2022-06-09 DIAGNOSIS — Z87891 Personal history of nicotine dependence: Secondary | ICD-10-CM | POA: Diagnosis not present

## 2022-06-09 DIAGNOSIS — Z9884 Bariatric surgery status: Secondary | ICD-10-CM | POA: Diagnosis not present

## 2022-06-09 DIAGNOSIS — D509 Iron deficiency anemia, unspecified: Secondary | ICD-10-CM | POA: Diagnosis present

## 2022-06-09 DIAGNOSIS — D508 Other iron deficiency anemias: Secondary | ICD-10-CM | POA: Diagnosis not present

## 2022-06-09 NOTE — Progress Notes (Signed)
Hematology/Oncology Progress note [phone] [phone]      Patient Care Team: Center, [facility] as PCP - General (General Practice) Rickard Patience, MD as Consulting Physician (Oncology)  ASSESSMENT & PLAN:   IDA (iron deficiency anemia) #Iron deficiency anemia in the context of gastric bypass Labs reviewed and discussed with patient Normal hemoglobin and iron panel.  Hold off IV Venofer at this point.    History of gastric bypass Check Folate and Vitamin B12 annually.   Orders Placed This Encounter  Procedures   CBC with Differential (Cancer Center Only)    Standing Status:   Future    Standing Expiration Date:   06/09/2023   CMP (Cancer Center only)    Standing Status:   Future    Standing Expiration Date:   06/09/2023   Vitamin B12    Standing Status:   Future    Standing Expiration Date:   06/09/2023   Ferritin    Standing Status:   Future    Standing Expiration Date:   06/09/2023   Iron and TIBC    Standing Status:   Future    Standing Expiration Date:   06/09/2023   Folate    Standing Status:   Future    Standing Expiration Date:   06/09/2023   Follow up in 12 months.  All questions were answered. The patient knows to call the clinic with any problems, questions or concerns.  Rickard Patience, MD, PhD [facility] Hematology Oncology 06/09/2022   CHIEF COMPLAINTS/REASON FOR VISIT:  iron deficiency anemia  HISTORY OF PRESENTING ILLNESS:  Melanie Olson is a  52 y.o.  female presents for follow up for iron deficiency anemia, gastric bypass  03/21/2020-03/24/2020 patient was recently admitted due to abdominal pain.   Patient has been seen by gastroenterology and EGD showed 8 mm ulcer at the gastrojejunal anastomosis.  Patient was recommended to continue Protonix twice daily as well as Carafate. Patient was discharged and she returned to emergency room on 03/27/2020 due to worsening of abdominal pain. She underwent a repeat CT scan which  revealed some increasing stranding and inflammation near the GJ anastomotic line with what appeared to be a possible contained perforation of the ulcer noted on her prior upper endoscopy.  No free fluid or free air was definitively noted.  Patient was treated with empiric IV antibiotics and antifungals.  Underwent upper GI barium swallow which did not show any perforation. Patient was also found to have anemia with iron deficiency. 03/21/2020, hemoglobin 6.7.  Status post PRBC transfusions in the hospital.  Patient was referred to establish care with hematology for treatment of iron deficiency anemia. Patient denies any abdominal pain today.  Since discharge, she has felt improvement of her fatigue.  Denies any black or bloody bowel movement.  Reports that menses is heavy sometimes.  INTERVAL HISTORY Melanie Olson is a 52 y.o. female who has above history reviewed by me today presents for follow up visit for management of Iron deficiency anemia Patient has previously received IV Venofer previously and tolerated well Today she reports feeling well.  She has no new complaints. Menstrual period has stopped about a year ago.   Review of Systems  Constitutional:  Negative for appetite change, chills, fatigue and fever.  HENT:   Negative for hearing loss and voice change.   Eyes:  Negative for eye problems.  Respiratory:  Negative for chest tightness and cough.   Cardiovascular:  Negative for chest pain.  Gastrointestinal:  Negative for abdominal distention, abdominal pain and blood in stool.  Endocrine: Negative for hot flashes.  Genitourinary:  Negative for difficulty urinating and frequency.   Musculoskeletal:  Negative for arthralgias.  Skin:  Negative for itching and rash.  Neurological:  Negative for extremity weakness.  Hematological:  Negative for adenopathy.  Psychiatric/Behavioral:  Negative for confusion.     MEDICAL HISTORY:  Past Medical History:  Diagnosis Date   Abdominal  wall ulcer (HCC)    Depression    GERD (gastroesophageal reflux disease)    HSV (herpes simplex virus) infection    IDA (iron deficiency anemia) 04/08/2020   Migraine     SURGICAL HISTORY: Past Surgical History:  Procedure Laterality Date   CHOLECYSTECTOMY     COLONOSCOPY WITH PROPOFOL N/A 07/11/2019   Procedure: COLONOSCOPY WITH PROPOFOL;  Surgeon: Pasty Spillers, MD;  Location: [facility] ENDOSCOPY;  Service: Endoscopy;  Laterality: N/A;   ESOPHAGOGASTRODUODENOSCOPY (EGD) WITH PROPOFOL N/A 07/11/2019   Procedure: ESOPHAGOGASTRODUODENOSCOPY (EGD) WITH PROPOFOL;  Surgeon: Pasty Spillers, MD;  Location: [facility] ENDOSCOPY;  Service: Endoscopy;  Laterality: N/A;   ESOPHAGOGASTRODUODENOSCOPY (EGD) WITH PROPOFOL N/A 03/23/2020   Procedure: ESOPHAGOGASTRODUODENOSCOPY (EGD) WITH PROPOFOL;  Surgeon: Pasty Spillers, MD;  Location: [facility] ENDOSCOPY;  Service: Endoscopy;  Laterality: N/A;   GASTRIC BYPASS  2006   tummy tuck      SOCIAL HISTORY: Social History   Socioeconomic History   Marital status: Significant Other    Spouse name: Not on file   Number of children: 2   Years of education: Not on file   Highest education level: Not on file  Occupational History   Not on file  Tobacco Use   Smoking status: Former    Years: 5    Types: Cigarettes    Quit date: 04/08/2008    Years since quitting: 14.1   Smokeless tobacco: Never   Tobacco comments:    social smoking on and off for 5 years   Vaping Use   Vaping Use: Never used  Substance and Sexual Activity   Alcohol use: No   Drug use: No   Sexual activity: Not on file  Other Topics Concern   Not on file  Social History Narrative   Not on file   Social Determinants of Health   Financial Resource Strain: Not on file  Food Insecurity: Not on file  Transportation Needs: Not on file  Physical Activity: Not on file  Stress: Not on file  Social Connections: Not on file  Intimate Partner Violence: Not on file    FAMILY  HISTORY: Family History  Problem Relation Age of Onset   High Cholesterol Mother    Stroke Father    Aneurysm Father    Diabetes Maternal Grandmother    Heart disease Paternal Grandmother     ALLERGIES:  is allergic to penicillins.  MEDICATIONS:  Current Outpatient Medications  Medication Sig Dispense Refill   acetaminophen (TYLENOL) 325 MG tablet Take 2 tablets (650 mg total) by mouth every 6 (six) hours as needed for mild pain (or Fever >/= 101).     acyclovir (ZOVIRAX) 200 MG capsule Take 2 capsules by mouth in the morning and at bedtime.     amitriptyline (ELAVIL) 25 MG tablet Take 25 mg by mouth at bedtime.     cyclobenzaprine (FLEXERIL) 10 MG tablet Take 10 mg by mouth at bedtime as needed for muscle spasms.     gabapentin (NEURONTIN) 300 MG capsule Take by mouth.  methocarbamol (ROBAXIN) 500 MG tablet Take 1 tablet (500 mg total) by mouth 4 (four) times daily. 30 tablet 0   Multiple Vitamin (MULTIVITAMIN) tablet Take 1 tablet by mouth daily. bariatric vitamins     oxyCODONE-acetaminophen (PERCOCET) 5-325 MG tablet Take 1 tablet by mouth every 6 (six) hours as needed for up to 20 doses for severe pain. 20 tablet 0   predniSONE (DELTASONE) 50 MG tablet Take 1 tablet (50 mg total) by mouth daily with breakfast. 5 tablet 0   sucralfate (CARAFATE) 1 GM/10ML suspension Take 10 mLs (1 g total) by mouth 4 (four) times daily -  with meals and at bedtime. 420 mL 2   sucralfate (CARAFATE) 1 GM/10ML suspension Take by mouth.     pantoprazole (PROTONIX) 40 MG tablet Take 1 tablet (40 mg total) by mouth 2 (two) times daily for 180 doses. (Patient not taking: Reported on 06/09/2022) 90 tablet 0   No current facility-administered medications for this visit.     PHYSICAL EXAMINATION: ECOG PERFORMANCE STATUS: 1 - Symptomatic but completely ambulatory Vitals:   06/09/22 1501  BP: 119/78  Pulse: 85  Temp: (!) 97.3 F (36.3 C)  SpO2: 98%   Filed Weights   06/09/22 1501  Weight: 183 lb  4.8 oz (83.1 kg)    Physical Exam Constitutional:      General: She is not in acute distress. HENT:     Head: Normocephalic and atraumatic.  Eyes:     General: No scleral icterus. Cardiovascular:     Rate and Rhythm: Normal rate and regular rhythm.     Heart sounds: Normal heart sounds.  Pulmonary:     Effort: Pulmonary effort is normal. No respiratory distress.     Breath sounds: No wheezing.  Abdominal:     General: Bowel sounds are normal. There is no distension.     Palpations: Abdomen is soft.  Musculoskeletal:        General: No deformity. Normal range of motion.     Cervical back: Normal range of motion and neck supple.  Skin:    General: Skin is warm and dry.     Findings: No erythema or rash.  Neurological:     Mental Status: She is alert and oriented to person, place, and time. Mental status is at baseline.     Cranial Nerves: No cranial nerve deficit.     Coordination: Coordination normal.  Psychiatric:        Mood and Affect: Mood normal.     LABORATORY DATA:  I have reviewed the data as listed    Latest Ref Rng & Units 06/03/2022    9:15 AM 05/27/2022    9:16 PM 12/08/2021    3:45 PM  CBC  WBC 4.0 - 10.5 K/uL 5.1  6.8  3.9   Hemoglobin 12.0 - 15.0 g/dL 16.1  09.6  04.5   Hematocrit 36.0 - 46.0 % 44.7  47.5  46.1   Platelets 150 - 400 K/uL 265  291  277       Latest Ref Rng & Units 05/27/2022    9:16 PM 12/08/2021    3:45 PM 06/02/2021    3:49 PM  CMP  Glucose 70 - 99 mg/dL 409  811  914   BUN 6 - 20 mg/dL 17  19  17    Creatinine 0.44 - 1.00 mg/dL 7.82  9.56  2.13   Sodium 135 - 145 mmol/L 138  138  135   Potassium 3.5 - 5.1  mmol/L 3.9  4.1  3.9   Chloride 98 - 111 mmol/L 103  104  100   CO2 22 - 32 mmol/L 28  26  28    Calcium 8.9 - 10.3 mg/dL 9.2  9.1  9.0   Total Protein 6.5 - 8.1 g/dL 7.6  7.7  7.3   Total Bilirubin 0.3 - 1.2 mg/dL 1.0  0.5  0.5   Alkaline Phos 38 - 126 U/L 90  79  77   AST 15 - 41 U/L 23  25  18    ALT 0 - 44 U/L 23  30  18        Iron/TIBC/Ferritin/ %Sat    Component Value Date/Time   IRON 92 06/03/2022 0915   TIBC 336 06/03/2022 0915   FERRITIN 61 06/03/2022 0915   IRONPCTSAT 27 06/03/2022 0915     RADIOGRAPHIC STUDIES: I have personally reviewed the radiological images as listed and agreed with the findings in the report. CT Chest W Contrast  Result Date: 05/27/2022 CLINICAL DATA:  Blunt chest trauma, right chest pain EXAM: CT CHEST WITH CONTRAST TECHNIQUE: Multidetector CT imaging of the chest was performed during intravenous contrast administration. RADIATION DOSE REDUCTION: This exam was performed according to the departmental dose-optimization program which includes automated exposure control, adjustment of the mA and/or kV according to patient size and/or use of iterative reconstruction technique. CONTRAST:  75mL OMNIPAQUE IOHEXOL 300 MG/ML  SOLN COMPARISON:  None Available. FINDINGS: Cardiovascular: Moderate left anterior descending coronary artery calcification. Global cardiac size within normal limits. No pericardial effusion. 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 Central pulmonary arteries are of normal caliber. Thoracic aorta is unremarkable. Mediastinum/Nodes: 16 mm left thyroid nodule, not well characterized on this exam. No pathologic thoracic adenopathy. Esophagus unremarkable. Lungs/Pleura: Bibasilar atelectasis. Lungs are otherwise clear. No pneumothorax or pleural effusion. Upper Abdomen: No acute abnormality. Surgical changes of cholecystectomy gastric bypass noted. Musculoskeletal: Acute minimally displaced fracture of the right seventh rib laterally. Osseous structures are otherwise age-appropriate. IMPRESSION: 1. Acute minimally displaced fracture of the right seventh rib laterally. No pneumothorax. 2. Moderate left anterior descending coronary artery calcification. 3. 16 mm left thyroid nodule, not  well characterized on this exam. Recommend thyroid US (ref: J Am Coll Radiol. 2015 Feb;12(2): 143-50). Electronically Signed   By: Helyn Numbers M.D.   On: 05/27/2022 22:19   DG Chest 2 View  Result Date: 05/27/2022 CLINICAL DATA:  Pain after fall EXAM: CHEST - 2 VIEW COMPARISON:  X-ray 12/22/2010 FINDINGS: No consolidation, pneumothorax or effusion. No edema. Normal cardiopericardial silhouette. Degenerative changes of the spine lateral view. Pectus carinatum IMPRESSION: No acute cardiopulmonary disease Electronically Signed   By: Karen Kays M.D.   On: 05/27/2022 18:21

## 2022-06-09 NOTE — Assessment & Plan Note (Signed)
#  Iron deficiency anemia in the context of gastric bypass Labs reviewed and discussed with patient Normal hemoglobin and iron panel.  Hold off IV Venofer at this point.

## 2022-06-09 NOTE — Assessment & Plan Note (Signed)
Check Folate and Vitamin B12 annually.

## 2022-06-15 LAB — JAK2 V617F RFX CALR/MPL/E12-15

## 2022-06-15 LAB — CALR +MPL + E12-E15  (REFLEX)

## 2022-07-22 ENCOUNTER — Other Ambulatory Visit: Payer: Self-pay | Admitting: Family Medicine

## 2022-07-22 DIAGNOSIS — Z1231 Encounter for screening mammogram for malignant neoplasm of breast: Secondary | ICD-10-CM

## 2022-08-13 LAB — COLOGUARD: COLOGUARD: NEGATIVE

## 2022-08-23 ENCOUNTER — Ambulatory Visit
Admission: RE | Admit: 2022-08-23 | Discharge: 2022-08-23 | Disposition: A | Discharge status: Home / Self Care | Payer: BC Managed Care – PPO | Source: Ambulatory Visit | Attending: Family Medicine | Admitting: Family Medicine

## 2022-08-23 DIAGNOSIS — Z1231 Encounter for screening mammogram for malignant neoplasm of breast: Secondary | ICD-10-CM | POA: Insufficient documentation

## 2023-06-09 ENCOUNTER — Inpatient Hospital Stay: Payer: BC Managed Care – PPO

## 2023-06-09 ENCOUNTER — Other Ambulatory Visit: Payer: BC Managed Care – PPO

## 2023-06-12 ENCOUNTER — Inpatient Hospital Stay: Payer: BC Managed Care – PPO | Admitting: Oncology

## 2023-06-12 ENCOUNTER — Ambulatory Visit: Payer: BC Managed Care – PPO | Admitting: Oncology

## 2023-06-12 ENCOUNTER — Inpatient Hospital Stay: Payer: BC Managed Care – PPO

## 2023-10-06 ENCOUNTER — Telehealth: Payer: Self-pay | Admitting: Oncology

## 2023-10-06 NOTE — Telephone Encounter (Signed)
 Pt had left vm to change her appts. I called pt back and adjusted MD appt to later time and date per pt request due to work schedule.

## 2023-10-10 ENCOUNTER — Inpatient Hospital Stay

## 2023-10-10 ENCOUNTER — Other Ambulatory Visit

## 2023-10-16 ENCOUNTER — Ambulatory Visit

## 2023-10-16 ENCOUNTER — Ambulatory Visit: Admitting: Oncology

## 2023-10-20 ENCOUNTER — Inpatient Hospital Stay

## 2023-10-20 ENCOUNTER — Telehealth: Payer: Self-pay | Admitting: Oncology

## 2023-10-20 NOTE — Telephone Encounter (Signed)
 Pt needed to r/s appts due to work schedule. Appts have been r/s

## 2023-10-23 ENCOUNTER — Inpatient Hospital Stay

## 2023-10-23 ENCOUNTER — Inpatient Hospital Stay: Admitting: Oncology

## 2023-11-03 ENCOUNTER — Inpatient Hospital Stay

## 2023-11-03 ENCOUNTER — Telehealth: Payer: Self-pay | Admitting: Oncology

## 2023-11-03 NOTE — Telephone Encounter (Signed)
 Pt called to r/s iron  infusion to around 12:15pm so she could go on her lunch break.  Unfortunately pt is also seeing MD this day and that is a scheduled lunch break for the clinic.  I have r/s appts with pt to next available (and moved labs prior to a closer date) pt was glad that the new 3:30pm est pt spot has went into effect due to her own work schedule.

## 2023-11-09 ENCOUNTER — Ambulatory Visit: Admitting: Oncology

## 2023-11-09 ENCOUNTER — Ambulatory Visit

## 2023-12-06 ENCOUNTER — Other Ambulatory Visit

## 2023-12-13 ENCOUNTER — Ambulatory Visit

## 2023-12-13 ENCOUNTER — Ambulatory Visit: Admitting: Oncology

## 2024-01-29 ENCOUNTER — Inpatient Hospital Stay: Attending: Oncology

## 2024-01-29 DIAGNOSIS — Z9884 Bariatric surgery status: Secondary | ICD-10-CM | POA: Insufficient documentation

## 2024-01-29 DIAGNOSIS — D508 Other iron deficiency anemias: Secondary | ICD-10-CM

## 2024-01-29 DIAGNOSIS — D509 Iron deficiency anemia, unspecified: Secondary | ICD-10-CM | POA: Insufficient documentation

## 2024-01-29 LAB — CBC WITH DIFFERENTIAL (CANCER CENTER ONLY)
Abs Immature Granulocytes: 0.01 K/uL (ref 0.00–0.07)
Basophils Absolute: 0 K/uL (ref 0.0–0.1)
Basophils Relative: 0 %
Eosinophils Absolute: 0.1 K/uL (ref 0.0–0.5)
Eosinophils Relative: 4 %
HCT: 42.6 % (ref 36.0–46.0)
Hemoglobin: 13.9 g/dL (ref 12.0–15.0)
Immature Granulocytes: 0 %
Lymphocytes Relative: 28 %
Lymphs Abs: 1 K/uL (ref 0.7–4.0)
MCH: 30.8 pg (ref 26.0–34.0)
MCHC: 32.6 g/dL (ref 30.0–36.0)
MCV: 94.5 fL (ref 80.0–100.0)
Monocytes Absolute: 0.3 K/uL (ref 0.1–1.0)
Monocytes Relative: 10 %
Neutro Abs: 2 K/uL (ref 1.7–7.7)
Neutrophils Relative %: 58 %
Platelet Count: 244 K/uL (ref 150–400)
RBC: 4.51 MIL/uL (ref 3.87–5.11)
RDW: 11.9 % (ref 11.5–15.5)
WBC Count: 3.5 K/uL — ABNORMAL LOW (ref 4.0–10.5)
nRBC: 0 % (ref 0.0–0.2)

## 2024-01-29 LAB — CMP (CANCER CENTER ONLY)
ALT: 30 U/L (ref 0–44)
AST: 24 U/L (ref 15–41)
Albumin: 4 g/dL (ref 3.5–5.0)
Alkaline Phosphatase: 134 U/L — ABNORMAL HIGH (ref 38–126)
Anion gap: 10 (ref 5–15)
BUN: 15 mg/dL (ref 6–20)
CO2: 26 mmol/L (ref 22–32)
Calcium: 9 mg/dL (ref 8.9–10.3)
Chloride: 106 mmol/L (ref 98–111)
Creatinine: 0.59 mg/dL (ref 0.44–1.00)
GFR, Estimated: >60 mL/min
Glucose, Bld: 110 mg/dL — ABNORMAL HIGH (ref 70–99)
Potassium: 3.7 mmol/L (ref 3.5–5.1)
Sodium: 141 mmol/L (ref 135–145)
Total Bilirubin: 0.8 mg/dL (ref 0.0–1.2)
Total Protein: 6.6 g/dL (ref 6.5–8.1)

## 2024-01-29 LAB — IRON AND TIBC
Iron: 105 ug/dL (ref 28–170)
Saturation Ratios: 33 % — ABNORMAL HIGH (ref 10.4–31.8)
TIBC: 323 ug/dL (ref 250–450)
UIBC: 218 ug/dL

## 2024-01-29 LAB — FOLATE: Folate: 8.8 ng/mL

## 2024-01-29 LAB — FERRITIN: Ferritin: 61 ng/mL (ref 11–307)

## 2024-01-29 LAB — VITAMIN B12: Vitamin B-12: 391 pg/mL (ref 180–914)

## 2024-01-30 ENCOUNTER — Encounter: Payer: Self-pay | Admitting: Oncology

## 2024-01-30 ENCOUNTER — Inpatient Hospital Stay: Admitting: Oncology

## 2024-01-30 ENCOUNTER — Inpatient Hospital Stay

## 2024-01-30 VITALS — BP 118/74 | HR 81 | Temp 98.6°F | Resp 19 | Wt 179.2 lb

## 2024-01-30 DIAGNOSIS — R790 Abnormal level of blood mineral: Secondary | ICD-10-CM

## 2024-01-30 DIAGNOSIS — D509 Iron deficiency anemia, unspecified: Secondary | ICD-10-CM | POA: Diagnosis not present

## 2024-01-30 DIAGNOSIS — K7581 Nonalcoholic steatohepatitis (NASH): Secondary | ICD-10-CM | POA: Diagnosis not present

## 2024-01-30 DIAGNOSIS — D508 Other iron deficiency anemias: Secondary | ICD-10-CM

## 2024-01-30 DIAGNOSIS — R748 Abnormal levels of other serum enzymes: Secondary | ICD-10-CM

## 2024-01-30 DIAGNOSIS — Z9884 Bariatric surgery status: Secondary | ICD-10-CM

## 2024-01-30 NOTE — Assessment & Plan Note (Signed)
 Check hemochromatosis mutation analysis

## 2024-01-30 NOTE — Assessment & Plan Note (Signed)
#  Iron  deficiency anemia in the context of gastric bypass Labs reviewed and discussed with patient Lab Results  Component Value Date   HGB 13.9 01/29/2024   TIBC 323 01/29/2024   IRONPCTSAT 33 (H) 01/29/2024   FERRITIN 61 01/29/2024     Hold off IV Venofer  at this point.

## 2024-01-30 NOTE — Assessment & Plan Note (Signed)
 Discussed with patient about lifestyle modification.  I will defer to primary care provider for management

## 2024-01-30 NOTE — Progress Notes (Signed)
 " Hematology/Oncology Progress note [phone] [phone]) [phone]      Patient Care Team: Center, [facility] as PCP - General (General Practice) Babara Call, MD as Consulting Physician (Oncology)  ASSESSMENT & PLAN:   IDA (iron  deficiency anemia) #Iron  deficiency anemia in the context of gastric bypass Labs reviewed and discussed with patient Lab Results  Component Value Date   HGB 13.9 01/29/2024   TIBC 323 01/29/2024   IRONPCTSAT 33 (H) 01/29/2024   FERRITIN 61 01/29/2024     Hold off IV Venofer  at this point.    History of gastric bypass Check Folate and Vitamin B12 periodically B12 lipids 391.  Borderline.  Recommend patient to take sublingual vitamin B12 1000 mcg 2-3 times per week.  Elevated alkaline phosphatase level Suspect that this is due to fatty liver disease.  Recommend patient to repeat levels and follow-up with primary care provider  NASH (nonalcoholic steatohepatitis) Discussed with patient about lifestyle modification.  I will defer to primary care provider for management  Abnormal iron  saturation Check hemochromatosis mutation analysis   Orders Placed This Encounter  Procedures   Hemochromatosis DNA-PCR(c282y,h63d)    Standing Status:   Future    Number of Occurrences:   1    Expected Date:   01/30/2024    Expiration Date:   01/29/2025   CBC with Differential (Cancer Center Only)    Standing Status:   Future    Expected Date:   01/29/2025    Expiration Date:   04/29/2025   Iron  and TIBC    Standing Status:   Future    Expected Date:   01/29/2025    Expiration Date:   04/29/2025   Ferritin    Standing Status:   Future    Expected Date:   01/29/2025    Expiration Date:   04/29/2025   Vitamin B12    Standing Status:   Future    Expected Date:   01/29/2025    Expiration Date:   04/29/2025   Folate    Standing Status:   Future    Expected Date:   01/29/2025    Expiration Date:   04/29/2025   Follow up in 12  months.  All questions were answered. The patient knows to call the clinic with any problems, questions or concerns.  Call Babara, MD, PhD [facility] Hematology Oncology 01/30/2024   CHIEF COMPLAINTS/REASON FOR VISIT:  iron  deficiency anemia  HISTORY OF PRESENTING ILLNESS:  Melanie Olson is a  54 y.o.  female presents for follow up for iron  deficiency anemia, gastric bypass  03/21/2020-03/24/2020 patient was recently admitted due to abdominal pain.   Patient has been seen by gastroenterology and EGD showed 8 mm ulcer at the gastrojejunal anastomosis.  Patient was recommended to continue Protonix  twice daily as well as Carafate . Patient was discharged and she returned to emergency room on 03/27/2020 due to worsening of abdominal pain. She underwent a repeat CT scan which revealed some increasing stranding and inflammation near the GJ anastomotic line with what appeared to be a possible contained perforation of the ulcer noted on her prior upper endoscopy.  No free fluid or free air was definitively noted.  Patient was treated with empiric IV antibiotics and antifungals.  Underwent upper GI barium swallow which did not show any perforation. Patient was also found to have anemia with iron  deficiency. 03/21/2020, hemoglobin 6.7.  Status post PRBC transfusions in the hospital.  Patient was referred to establish care with hematology for  treatment of iron  deficiency anemia. Patient denies any abdominal pain today.  Since discharge, she has felt improvement of her fatigue.  Denies any black or bloody bowel movement.  Reports that menses is heavy sometimes.  INTERVAL HISTORY Melanie Olson is a 53 y.o. female who has above history reviewed by me today presents for follow up visit for management of Iron  deficiency anemia Patient has previously received IV Venofer  previously and tolerated well Today she reports feeling well.  She has no new complaints.    Review of Systems  Constitutional:  Negative  for appetite change, chills, fatigue and fever.  HENT:   Negative for hearing loss and voice change.   Eyes:  Negative for eye problems.  Respiratory:  Negative for chest tightness and cough.   Cardiovascular:  Negative for chest pain.  Gastrointestinal:  Negative for abdominal distention, abdominal pain and blood in stool.  Endocrine: Negative for hot flashes.  Genitourinary:  Negative for difficulty urinating and frequency.   Musculoskeletal:  Negative for arthralgias.  Skin:  Negative for itching and rash.  Neurological:  Negative for extremity weakness.  Hematological:  Negative for adenopathy.  Psychiatric/Behavioral:  Negative for confusion.     MEDICAL HISTORY:  Past Medical History:  Diagnosis Date   Abdominal wall ulcer    Depression    GERD (gastroesophageal reflux disease)    HSV (herpes simplex virus) infection    IDA (iron  deficiency anemia) 04/08/2020   Migraine     SURGICAL HISTORY: Past Surgical History:  Procedure Laterality Date   CHOLECYSTECTOMY     COLONOSCOPY WITH PROPOFOL  N/A 07/11/2019   Procedure: COLONOSCOPY WITH PROPOFOL ;  Surgeon: Janalyn Keene NOVAK, MD;  Location: [facility];  Service: Endoscopy;  Laterality: N/A;   ESOPHAGOGASTRODUODENOSCOPY (EGD) WITH PROPOFOL  N/A 07/11/2019   Procedure: ESOPHAGOGASTRODUODENOSCOPY (EGD) WITH PROPOFOL ;  Surgeon: Janalyn Keene NOVAK, MD;  Location: [facility] END[facility]COPY;  Service: Endoscopy;  Laterality: N/A;   ESOPHAGOGASTRODUODENOSCOPY (EGD) WITH PROPOFOL  N/A 03/23/2020   Procedure: ESOPHAGOGASTRODUODENOSCOPY (EGD) WITH PROPOFOL ;  Surgeon: Janalyn Keene NOVAK, MD;  Location: [facility]COPY;  Service: Endoscopy;  Laterality: N/A;   GASTRIC BYPASS  2006   tummy tuck      SOCIAL HISTORY: Social History   Socioeconomic History   Marital status: Significant Other    Spouse name: Not on file   Number of children: 2   Years of education: Not on file   Highest education level: Not on file  Occupational History   Not  on file  Tobacco Use   Smoking status: Former    Current packs/day: 0.00    Types: Cigarettes    Start date: 04/09/2003    Quit date: 04/08/2008    Years since quitting: 15.8   Smokeless tobacco: Never   Tobacco comments:    social smoking on and off for 5 years   Vaping Use   Vaping status: Never Used  Substance and Sexual Activity   Alcohol use: No   Drug use: No   Sexual activity: Not on file  Other Topics Concern   Not on file  Social History Narrative   Not on file   Social Drivers of Health   Tobacco Use: Medium Risk (01/30/2024)   Patient History    Smoking Tobacco Use: Former    Smokeless Tobacco Use: Never    Passive Exposure: Not on Actuary Strain: Not on file  Food Insecurity: Not on file  Transportation Needs: Not on file  Physical Activity: Not on file  Stress: Not on file  Social Connections: Not on file  Intimate Partner Violence: Not At Risk (04/25/2023)   Received from [facility]   Epic    Within the last year, have you been afraid of your partner or ex-partner?: No    Within the last year, have you been humiliated or emotionally abused in other ways by your partner or ex-partner?: No    Within the last year, have you been kicked, hit, slapped, or otherwise physically hurt by your partner or ex-partner?: No    Within the last year, have you been raped or forced to have any kind of sexual activity by your partner or ex-partner?: No  Depression (PHQ2-9): Not on file  Alcohol Screen: Not on file  Housing: Not on file  Utilities: Not on file  Health Literacy: Not on file    FAMILY HISTORY: Family History  Problem Relation Age of Onset   High Cholesterol Mother    Stroke Father    Aneurysm Father    Diabetes Maternal Grandmother    Heart disease Paternal Grandmother     ALLERGIES:  is allergic to penicillins.  MEDICATIONS:  Current Outpatient Medications  Medication Sig Dispense Refill   acetaminophen  (TYLENOL ) 325 MG tablet  Take 2 tablets (650 mg total) by mouth every 6 (six) hours as needed for mild pain (or Fever >/= 101).     acyclovir (ZOVIRAX) 200 MG capsule Take 2 capsules by mouth in the morning and at bedtime.     amitriptyline  (ELAVIL ) 25 MG tablet Take 25 mg by mouth at bedtime.     pantoprazole  (PROTONIX ) 40 MG tablet Take 1 tablet (40 mg total) by mouth 2 (two) times daily for 180 doses. 90 tablet 0   cyclobenzaprine (FLEXERIL) 10 MG tablet Take 10 mg by mouth at bedtime as needed for muscle spasms. (Patient not taking: Reported on 01/30/2024)     methocarbamol  (ROBAXIN ) 500 MG tablet Take 1 tablet (500 mg total) by mouth 4 (four) times daily. (Patient not taking: Reported on 01/30/2024) 30 tablet 0   Multiple Vitamin (MULTIVITAMIN) tablet Take 1 tablet by mouth daily. bariatric vitamins (Patient not taking: Reported on 01/30/2024)     oxyCODONE -acetaminophen  (PERCOCET) 5-325 MG tablet Take 1 tablet by mouth every 6 (six) hours as needed for up to 20 doses for severe pain. (Patient not taking: Reported on 01/30/2024) 20 tablet 0   predniSONE  (DELTASONE ) 50 MG tablet Take 1 tablet (50 mg total) by mouth daily with breakfast. (Patient not taking: Reported on 01/30/2024) 5 tablet 0   sucralfate  (CARAFATE ) 1 GM/10ML suspension Take 10 mLs (1 g total) by mouth 4 (four) times daily -  with meals and at bedtime. (Patient not taking: Reported on 01/30/2024) 420 mL 2   sucralfate  (CARAFATE ) 1 GM/10ML suspension Take by mouth. (Patient not taking: Reported on 01/30/2024)     No current facility-administered medications for this visit.     PHYSICAL EXAMINATION: ECOG PERFORMANCE STATUS: 1 - Symptomatic but completely ambulatory Vitals:   01/30/24 1012  BP: 118/74  Pulse: 81  Resp: 19  Temp: 98.6 F (37 C)  SpO2: 99%   Filed Weights   01/30/24 1012  Weight: 179 lb 3.2 oz (81.3 kg)    Physical Exam Constitutional:      General: She is not in acute distress. HENT:     Head: Normocephalic and atraumatic.   Eyes:     General: No scleral icterus. Cardiovascular:     Rate and Rhythm:  Normal rate and regular rhythm.     Heart sounds: Normal heart sounds.  Pulmonary:     Effort: Pulmonary effort is normal. No respiratory distress.     Breath sounds: No wheezing.  Abdominal:     General: Bowel sounds are normal. There is no distension.     Palpations: Abdomen is soft.  Musculoskeletal:        General: No deformity. Normal range of motion.     Cervical back: Normal range of motion and neck supple.  Skin:    General: Skin is warm and dry.     Findings: No erythema or rash.  Neurological:     Mental Status: She is alert and oriented to person, place, and time. Mental status is at baseline.     Cranial Nerves: No cranial nerve deficit.     Coordination: Coordination normal.  Psychiatric:        Mood and Affect: Mood normal.     LABORATORY DATA:  I have reviewed the data as listed    Latest Ref Rng & Units 01/29/2024    8:08 AM 06/03/2022    9:15 AM 05/27/2022    9:16 PM  CBC  WBC 4.0 - 10.5 K/uL 3.5  5.1  6.8   Hemoglobin 12.0 - 15.0 g/dL 86.0  85.2  84.4   Hematocrit 36.0 - 46.0 % 42.6  44.7  47.5   Platelets 150 - 400 K/uL 244  265  291       Latest Ref Rng & Units 01/29/2024    8:08 AM 05/27/2022    9:16 PM 12/08/2021    3:45 PM  CMP  Glucose 70 - 99 mg/dL 889  878  891   BUN 6 - 20 mg/dL 15  17  19    Creatinine 0.44 - 1.00 mg/dL 9.40  9.39  9.52   Sodium 135 - 145 mmol/L 141  138  138   Potassium 3.5 - 5.1 mmol/L 3.7  3.9  4.1   Chloride 98 - 111 mmol/L 106  103  104   CO2 22 - 32 mmol/L 26  28  26    Calcium 8.9 - 10.3 mg/dL 9.0  9.2  9.1   Total Protein 6.5 - 8.1 g/dL 6.6  7.6  7.7   Total Bilirubin 0.0 - 1.2 mg/dL 0.8  1.0  0.5   Alkaline Phos 38 - 126 U/L 134  90  79   AST 15 - 41 U/L 24  23  25    ALT 0 - 44 U/L 30  23  30       Iron /TIBC/Ferritin/ %Sat    Component Value Date/Time   IRON  105 01/29/2024 0808   TIBC 323 01/29/2024 0808   FERRITIN 61 01/29/2024  0808   IRONPCTSAT 33 (H) 01/29/2024 0808     RADIOGRAPHIC STUDIES: I have personally reviewed the radiological images as listed and agreed with the findings in the report. No results found.    "

## 2024-01-30 NOTE — Assessment & Plan Note (Signed)
 Suspect that this is due to fatty liver disease.  Recommend patient to repeat levels and follow-up with primary care provider

## 2024-01-30 NOTE — Assessment & Plan Note (Addendum)
 Check Folate and Vitamin B12 periodically B12 lipids 391.  Borderline.  Recommend patient to take sublingual vitamin B12 1000 mcg 2-3 times per week.

## 2024-02-07 LAB — HEMOCHROMATOSIS DNA-PCR(C282Y,H63D)

## 2024-03-05 ENCOUNTER — Encounter: Payer: Self-pay | Admitting: Oncology

## 2024-03-05 ENCOUNTER — Emergency Department

## 2024-03-05 ENCOUNTER — Emergency Department
Admission: EM | Admit: 2024-03-05 | Discharge: 2024-03-05 | Disposition: A | Discharge status: Home / Self Care | Attending: Emergency Medicine | Admitting: Emergency Medicine

## 2024-03-05 ENCOUNTER — Other Ambulatory Visit: Payer: Self-pay

## 2024-03-05 DIAGNOSIS — R519 Headache, unspecified: Secondary | ICD-10-CM | POA: Diagnosis not present

## 2024-03-05 DIAGNOSIS — R079 Chest pain, unspecified: Secondary | ICD-10-CM

## 2024-03-05 DIAGNOSIS — R0789 Other chest pain: Secondary | ICD-10-CM | POA: Diagnosis present

## 2024-03-05 DIAGNOSIS — Y92410 Unspecified street and highway as the place of occurrence of the external cause: Secondary | ICD-10-CM | POA: Diagnosis not present

## 2024-03-05 DIAGNOSIS — M7918 Myalgia, other site: Secondary | ICD-10-CM

## 2024-03-05 DIAGNOSIS — E041 Nontoxic single thyroid nodule: Secondary | ICD-10-CM | POA: Insufficient documentation

## 2024-03-05 LAB — COMPREHENSIVE METABOLIC PANEL WITH GFR
ALT: 40 U/L (ref 0–44)
AST: 39 U/L (ref 15–41)
Albumin: 3.9 g/dL (ref 3.5–5.0)
Alkaline Phosphatase: 148 U/L — ABNORMAL HIGH (ref 38–126)
Anion gap: 11 (ref 5–15)
BUN: 21 mg/dL — ABNORMAL HIGH (ref 6–20)
CO2: 25 mmol/L (ref 22–32)
Calcium: 9.1 mg/dL (ref 8.9–10.3)
Chloride: 107 mmol/L (ref 98–111)
Creatinine, Ser: 0.54 mg/dL (ref 0.44–1.00)
GFR, Estimated: >60 mL/min
Glucose, Bld: 89 mg/dL (ref 70–99)
Potassium: 3.9 mmol/L (ref 3.5–5.1)
Sodium: 142 mmol/L (ref 135–145)
Total Bilirubin: 0.7 mg/dL (ref 0.0–1.2)
Total Protein: 6.6 g/dL (ref 6.5–8.1)

## 2024-03-05 LAB — TROPONIN T, HIGH SENSITIVITY: Troponin T High Sensitivity: 9 ng/L (ref 0–19)

## 2024-03-05 LAB — CBC WITH DIFFERENTIAL/PLATELET
Abs Immature Granulocytes: 0.02 10*3/uL (ref 0.00–0.07)
Basophils Absolute: 0 10*3/uL (ref 0.0–0.1)
Basophils Relative: 0 %
Eosinophils Absolute: 0.1 10*3/uL (ref 0.0–0.5)
Eosinophils Relative: 2 %
HCT: 42.2 % (ref 36.0–46.0)
Hemoglobin: 13.7 g/dL (ref 12.0–15.0)
Immature Granulocytes: 0 %
Lymphocytes Relative: 27 %
Lymphs Abs: 1.3 10*3/uL (ref 0.7–4.0)
MCH: 31.1 pg (ref 26.0–34.0)
MCHC: 32.5 g/dL (ref 30.0–36.0)
MCV: 95.9 fL (ref 80.0–100.0)
Monocytes Absolute: 0.3 10*3/uL (ref 0.1–1.0)
Monocytes Relative: 6 %
Neutro Abs: 3.2 10*3/uL (ref 1.7–7.7)
Neutrophils Relative %: 65 %
Platelets: 247 10*3/uL (ref 150–400)
RBC: 4.4 MIL/uL (ref 3.87–5.11)
RDW: 11.9 % (ref 11.5–15.5)
WBC: 4.9 10*3/uL (ref 4.0–10.5)
nRBC: 0 % (ref 0.0–0.2)

## 2024-03-05 MED ORDER — ONDANSETRON 4 MG PO TBDP
4.0000 mg | ORAL_TABLET | Freq: Once | ORAL | Status: AC
  Administered 2024-03-05: 4 mg via ORAL
  Filled 2024-03-05: qty 1

## 2024-03-05 MED ORDER — ACETAMINOPHEN 500 MG PO TABS
1000.0000 mg | ORAL_TABLET | Freq: Once | ORAL | Status: AC
  Administered 2024-03-05: 1000 mg via ORAL
  Filled 2024-03-05: qty 2

## 2024-03-05 MED ORDER — HYDROCODONE-ACETAMINOPHEN 5-325 MG PO TABS: 1.0000 | ORAL_TABLET | Freq: Four times a day (QID) | ORAL | 0 refills | Status: AC | PRN

## 2024-03-05 MED ORDER — ONDANSETRON 4 MG PO TBDP: 4.0000 mg | ORAL_TABLET | Freq: Three times a day (TID) | ORAL | 0 refills | Status: AC | PRN
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
## 2024-03-05 NOTE — Discharge Instructions (Signed)
 Call make a follow-up appoint with your primary care provider for evaluation and for an outpatient ultrasound of your thyroid.  2 prescriptions were sent to the pharmacy 1 is for nausea and the other is for pain.  Do not take extra Tylenol  with this as it has Tylenol  in with the hydrocodone .  This medication could cause drowsiness therefore do not take this medication while driving or operating machinery.  You can expect to be sore for approximately 4 to 5 days which should continue to improve every day.

## 2024-03-05 NOTE — ED Notes (Signed)
 Lab called at 1348 to come get blood from pt.
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
## 2025-01-17 ENCOUNTER — Inpatient Hospital Stay

## 2025-01-22 ENCOUNTER — Inpatient Hospital Stay: Admitting: Oncology

## 2025-01-24 ENCOUNTER — Inpatient Hospital Stay: Admitting: Oncology
